# Patient Record
Sex: Male | Born: 1970
Health system: Southern US, Community
[De-identification: ages and names within clinical notes are randomized; demographics above are authoritative.]

## PROBLEM LIST (undated history)

## (undated) DIAGNOSIS — T7840XA Allergy, unspecified, initial encounter: Secondary | ICD-10-CM

## (undated) DIAGNOSIS — Z9889 Other specified postprocedural states: Secondary | ICD-10-CM

## (undated) DIAGNOSIS — M1990 Unspecified osteoarthritis, unspecified site: Secondary | ICD-10-CM

## (undated) DIAGNOSIS — K5090 Crohn's disease, unspecified, without complications: Secondary | ICD-10-CM

## (undated) DIAGNOSIS — Z5189 Encounter for other specified aftercare: Secondary | ICD-10-CM

## (undated) DIAGNOSIS — I480 Paroxysmal atrial fibrillation: Secondary | ICD-10-CM

## (undated) HISTORY — PX: ABLATION: SHX5711

## (undated) HISTORY — DX: Crohn's disease, unspecified, without complications: K50.90

## (undated) HISTORY — DX: Unspecified osteoarthritis, unspecified site: M19.90

## (undated) HISTORY — DX: Other specified postprocedural states: Z98.890

## (undated) HISTORY — DX: Paroxysmal atrial fibrillation: I48.0

## (undated) HISTORY — PX: ESOPHAGEAL DILATION: SHX303

## (undated) HISTORY — DX: Encounter for other specified aftercare: Z51.89

## (undated) HISTORY — DX: Allergy, unspecified, initial encounter: T78.40XA

## (undated) HISTORY — PX: FRACTURE SURGERY: SHX138

---

## 1997-11-14 ENCOUNTER — Other Ambulatory Visit: Admission: RE | Admit: 1997-11-14 | Discharge: 1997-11-14 | Payer: Self-pay | Admitting: Gastroenterology

## 1997-11-22 ENCOUNTER — Ambulatory Visit (HOSPITAL_COMMUNITY): Admission: RE | Admit: 1997-11-22 | Discharge: 1997-11-22 | Payer: Self-pay | Admitting: Gastroenterology

## 1997-12-05 ENCOUNTER — Ambulatory Visit (HOSPITAL_COMMUNITY): Admission: RE | Admit: 1997-12-05 | Discharge: 1997-12-05 | Payer: Self-pay | Admitting: Gastroenterology

## 1997-12-06 ENCOUNTER — Ambulatory Visit (HOSPITAL_COMMUNITY): Admission: RE | Admit: 1997-12-06 | Discharge: 1997-12-06 | Payer: Self-pay | Admitting: Gastroenterology

## 1998-12-10 ENCOUNTER — Ambulatory Visit (HOSPITAL_COMMUNITY): Admission: RE | Admit: 1998-12-10 | Discharge: 1998-12-10 | Payer: Self-pay | Admitting: Podiatry

## 2000-07-14 ENCOUNTER — Emergency Department (HOSPITAL_COMMUNITY): Admission: EM | Admit: 2000-07-14 | Discharge: 2000-07-14 | Payer: Self-pay | Admitting: Emergency Medicine

## 2000-12-18 ENCOUNTER — Emergency Department (HOSPITAL_COMMUNITY): Admission: EM | Admit: 2000-12-18 | Discharge: 2000-12-18 | Payer: Self-pay | Admitting: Emergency Medicine

## 2003-09-14 ENCOUNTER — Emergency Department (HOSPITAL_COMMUNITY): Admission: EM | Admit: 2003-09-14 | Discharge: 2003-09-14 | Payer: Self-pay

## 2004-10-17 ENCOUNTER — Ambulatory Visit: Payer: Self-pay | Admitting: Gastroenterology

## 2005-11-15 ENCOUNTER — Ambulatory Visit (HOSPITAL_COMMUNITY): Admission: EM | Admit: 2005-11-15 | Discharge: 2005-11-15 | Payer: Self-pay | Admitting: *Deleted

## 2005-11-15 DIAGNOSIS — K449 Diaphragmatic hernia without obstruction or gangrene: Secondary | ICD-10-CM | POA: Insufficient documentation

## 2005-11-15 DIAGNOSIS — T18108A Unspecified foreign body in esophagus causing other injury, initial encounter: Secondary | ICD-10-CM | POA: Insufficient documentation

## 2005-11-18 ENCOUNTER — Ambulatory Visit: Payer: Self-pay | Admitting: Internal Medicine

## 2005-11-20 ENCOUNTER — Ambulatory Visit: Payer: Self-pay | Admitting: Gastroenterology

## 2005-11-28 ENCOUNTER — Ambulatory Visit (HOSPITAL_COMMUNITY): Admission: RE | Admit: 2005-11-28 | Discharge: 2005-11-28 | Payer: Self-pay | Admitting: Gastroenterology

## 2006-08-07 ENCOUNTER — Ambulatory Visit: Payer: Self-pay | Admitting: Sports Medicine

## 2006-08-07 DIAGNOSIS — M25579 Pain in unspecified ankle and joints of unspecified foot: Secondary | ICD-10-CM | POA: Insufficient documentation

## 2006-08-07 DIAGNOSIS — M2020 Hallux rigidus, unspecified foot: Secondary | ICD-10-CM | POA: Insufficient documentation

## 2007-01-11 ENCOUNTER — Ambulatory Visit: Payer: Self-pay | Admitting: Gastroenterology

## 2007-01-11 LAB — CONVERTED CEMR LAB
ALT: 20 units/L (ref 0–53)
AST: 18 units/L (ref 0–37)
Alkaline Phosphatase: 59 units/L (ref 39–117)
BUN: 18 mg/dL (ref 6–23)
Basophils Absolute: 0 10*3/uL (ref 0.0–0.1)
Basophils Relative: 0.1 % (ref 0.0–1.0)
Chloride: 108 meq/L (ref 96–112)
Creatinine, Ser: 0.8 mg/dL (ref 0.4–1.5)
Eosinophils Absolute: 0.3 10*3/uL (ref 0.0–0.6)
GFR calc non Af Amer: 116 mL/min
Glucose, Bld: 109 mg/dL — ABNORMAL HIGH (ref 70–99)
Hemoglobin: 15.2 g/dL (ref 13.0–17.0)
Monocytes Absolute: 0.5 10*3/uL (ref 0.2–0.7)
Potassium: 4.2 meq/L (ref 3.5–5.1)
Sodium: 145 meq/L (ref 135–145)
Total Protein: 6.6 g/dL (ref 6.0–8.3)

## 2007-01-25 ENCOUNTER — Ambulatory Visit (HOSPITAL_COMMUNITY): Admission: RE | Admit: 2007-01-25 | Discharge: 2007-01-25 | Payer: Self-pay | Admitting: Gastroenterology

## 2007-01-25 ENCOUNTER — Encounter: Payer: Self-pay | Admitting: Gastroenterology

## 2007-01-25 DIAGNOSIS — K222 Esophageal obstruction: Secondary | ICD-10-CM | POA: Insufficient documentation

## 2007-02-04 ENCOUNTER — Ambulatory Visit: Payer: Self-pay | Admitting: Gastroenterology

## 2007-02-19 ENCOUNTER — Ambulatory Visit: Payer: Self-pay | Admitting: Gastroenterology

## 2007-02-19 ENCOUNTER — Encounter: Payer: Self-pay | Admitting: Gastroenterology

## 2007-03-01 DIAGNOSIS — D139 Benign neoplasm of ill-defined sites within the digestive system: Secondary | ICD-10-CM | POA: Insufficient documentation

## 2007-03-01 DIAGNOSIS — D1399 Benign neoplasm of ill-defined sites within the digestive system: Secondary | ICD-10-CM | POA: Insufficient documentation

## 2007-06-22 DIAGNOSIS — J45909 Unspecified asthma, uncomplicated: Secondary | ICD-10-CM | POA: Insufficient documentation

## 2007-06-22 DIAGNOSIS — K5090 Crohn's disease, unspecified, without complications: Secondary | ICD-10-CM | POA: Insufficient documentation

## 2007-06-22 DIAGNOSIS — R1310 Dysphagia, unspecified: Secondary | ICD-10-CM | POA: Insufficient documentation

## 2007-08-19 ENCOUNTER — Ambulatory Visit: Payer: Self-pay | Admitting: Internal Medicine

## 2007-08-19 DIAGNOSIS — K4090 Unilateral inguinal hernia, without obstruction or gangrene, not specified as recurrent: Secondary | ICD-10-CM | POA: Insufficient documentation

## 2007-08-19 DIAGNOSIS — J301 Allergic rhinitis due to pollen: Secondary | ICD-10-CM | POA: Insufficient documentation

## 2007-08-19 DIAGNOSIS — F909 Attention-deficit hyperactivity disorder, unspecified type: Secondary | ICD-10-CM | POA: Insufficient documentation

## 2007-08-19 DIAGNOSIS — J309 Allergic rhinitis, unspecified: Secondary | ICD-10-CM | POA: Insufficient documentation

## 2007-08-19 LAB — CONVERTED CEMR LAB
AST: 24 units/L (ref 0–37)
Albumin: 4 g/dL (ref 3.5–5.2)
BUN: 18 mg/dL (ref 6–23)
Basophils Absolute: 0.1 10*3/uL (ref 0.0–0.1)
Bilirubin, Direct: 0.1 mg/dL (ref 0.0–0.3)
CO2: 32 meq/L (ref 19–32)
Calcium: 9.7 mg/dL (ref 8.4–10.5)
Eosinophils Absolute: 0.4 10*3/uL (ref 0.0–0.7)
GFR calc Af Amer: 162 mL/min
Glucose, Bld: 91 mg/dL (ref 70–99)
HCT: 44 % (ref 39.0–52.0)
HDL: 25.3 mg/dL — ABNORMAL LOW (ref >39.0)
Ketones, ur: NEGATIVE mg/dL
LDL Cholesterol: 123 mg/dL — ABNORMAL HIGH (ref 0–99)
Lymphocytes Relative: 29.1 % (ref 12.0–46.0)
MCV: 89 fL (ref 78.0–100.0)
Monocytes Absolute: 0.5 10*3/uL (ref 0.1–1.0)
Monocytes Relative: 10.5 % (ref 3.0–12.0)
Neutro Abs: 2.6 10*3/uL (ref 1.4–7.7)
Neutrophils Relative %: 51.2 % (ref 43.0–77.0)
Platelets: 308 10*3/uL (ref 150–400)
Potassium: 4.7 meq/L (ref 3.5–5.1)
RDW: 11.4 % — ABNORMAL LOW (ref 11.5–14.6)
Specific Gravity, Urine: 1.025 (ref 1.000–1.03)
TSH: 0.95 microintl units/mL (ref 0.35–5.50)
Total Bilirubin: 1 mg/dL (ref 0.3–1.2)
Total CHOL/HDL Ratio: 6.6
VLDL: 19 mg/dL (ref 0–40)
pH: 6 (ref 5.0–8.0)

## 2007-08-23 ENCOUNTER — Encounter (INDEPENDENT_AMBULATORY_CARE_PROVIDER_SITE_OTHER): Payer: Self-pay | Admitting: *Deleted

## 2007-09-21 ENCOUNTER — Encounter: Payer: Self-pay | Admitting: Internal Medicine

## 2007-09-21 ENCOUNTER — Encounter: Payer: Self-pay | Admitting: Gastroenterology

## 2009-05-11 ENCOUNTER — Encounter: Payer: Self-pay | Admitting: Internal Medicine

## 2009-08-06 ENCOUNTER — Telehealth: Payer: Self-pay | Admitting: Gastroenterology

## 2009-08-07 ENCOUNTER — Ambulatory Visit: Payer: Self-pay | Admitting: Gastroenterology

## 2009-08-07 DIAGNOSIS — R1033 Periumbilical pain: Secondary | ICD-10-CM | POA: Insufficient documentation

## 2009-08-07 DIAGNOSIS — R634 Abnormal weight loss: Secondary | ICD-10-CM | POA: Insufficient documentation

## 2009-08-07 DIAGNOSIS — K219 Gastro-esophageal reflux disease without esophagitis: Secondary | ICD-10-CM | POA: Insufficient documentation

## 2009-08-07 DIAGNOSIS — R112 Nausea with vomiting, unspecified: Secondary | ICD-10-CM | POA: Insufficient documentation

## 2009-08-07 DIAGNOSIS — R109 Unspecified abdominal pain: Secondary | ICD-10-CM | POA: Insufficient documentation

## 2009-08-07 DIAGNOSIS — K5000 Crohn's disease of small intestine without complications: Secondary | ICD-10-CM | POA: Insufficient documentation

## 2009-08-07 DIAGNOSIS — R1013 Epigastric pain: Secondary | ICD-10-CM | POA: Insufficient documentation

## 2009-08-07 DIAGNOSIS — R110 Nausea: Secondary | ICD-10-CM | POA: Insufficient documentation

## 2009-08-08 ENCOUNTER — Encounter: Payer: Self-pay | Admitting: Physician Assistant

## 2009-08-08 ENCOUNTER — Encounter: Payer: Self-pay | Admitting: Gastroenterology

## 2009-08-09 LAB — CONVERTED CEMR LAB
Albumin: 4.1 g/dL (ref 3.5–5.2)
Calcium: 9.1 mg/dL (ref 8.4–10.5)
Eosinophils Absolute: 0.9 10*3/uL — ABNORMAL HIGH (ref 0.0–0.7)
Eosinophils Relative: 12.2 % — ABNORMAL HIGH (ref 0.0–5.0)
GFR calc non Af Amer: 132.58 mL/min (ref >60)
Glucose, Bld: 96 mg/dL (ref 70–99)
HCT: 42.4 % (ref 39.0–52.0)
Hemoglobin: 15.1 g/dL (ref 13.0–17.0)
Lymphocytes Relative: 23.2 % (ref 12.0–46.0)
MCHC: 35.5 g/dL (ref 30.0–36.0)
MCV: 90.6 fL (ref 78.0–100.0)
Monocytes Relative: 8.3 % (ref 3.0–12.0)
Neutrophils Relative %: 55.9 % (ref 43.0–77.0)
Platelets: 298 10*3/uL (ref 150.0–400.0)
Potassium: 4.4 meq/L (ref 3.5–5.1)
RBC: 4.68 M/uL (ref 4.22–5.81)
RDW: 12.6 % (ref 11.5–14.6)
Total Bilirubin: 0.8 mg/dL (ref 0.3–1.2)
Total Protein: 6.6 g/dL (ref 6.0–8.3)

## 2009-08-10 ENCOUNTER — Ambulatory Visit: Payer: Self-pay | Admitting: Physician Assistant

## 2009-08-13 ENCOUNTER — Ambulatory Visit: Payer: Self-pay | Admitting: Cardiology

## 2009-10-08 ENCOUNTER — Ambulatory Visit: Payer: Self-pay | Admitting: Gastroenterology

## 2009-12-18 ENCOUNTER — Ambulatory Visit: Payer: Self-pay | Admitting: Gastroenterology

## 2010-05-14 NOTE — Assessment & Plan Note (Signed)
Summary: Crohn's....em    History of Present Illness Visit Type: Follow-up Visit Primary GI MD: Melvia Heaps MD [facility] Primary Provider: n/a Requesting Provider: n/a Chief Complaint: F/u  from Crohn's. Patient states that it feels like food is not going down when he swallows, diarrhea, and nausea  History of Present Illness:   Daniel Orozco has returned for followup of his abdominal pain.  CT Scan and lab work including CRP were unremarkable.  He took a two-week course of prednisone beginning at 20 mg daily.  His symptoms did improve though they remain.  He is without nausea and vomiting but still has postprandial fullness.  He's off prednisone and is taking  Pentasa 2 g twice a day.   GI Review of Systems    Reports nausea.      Denies abdominal pain, acid reflux, belching, bloating, chest pain, dysphagia with liquids, dysphagia with solids, heartburn, loss of appetite, vomiting, vomiting blood, weight loss, and  weight gain.      Reports diarrhea.     Denies anal fissure, black tarry stools, change in bowel habit, constipation, diverticulosis, fecal incontinence, heme positive stool, hemorrhoids, irritable bowel syndrome, jaundice, light color stool, liver problems, rectal bleeding, and  rectal pain.    Current Medications (verified): 1)  Citalopram Hydrobromide 40 Mg  Tabs (Citalopram Hydrobromide) .... 2 By Mouth Qd 2)  Divalproex Sodium 500 Mg Xr24h-Tab (Divalproex Sodium) .Marland Kitchen.. 1 By Mouth Three Times A Day 3)  Pentasa 500 Mg Cr-Caps (Mesalamine) .... Take 4 Tab 2 Times Daily 4)  Bentyl 10 Mg Caps (Dicyclomine Hcl) .... Take 1 Tab Twice Daily  Allergies (verified): 1)  ! * Codiene  Past History:  Past Medical History: Reviewed history from 08/07/2009 and no changes required. Current Problems:  ASTHMA (ICD-493.90) CROHNS DISEASE  HIATAL HERNIA (ICD-553.3)  ESOPHAGEAL STRICTURE (ICD-530.3)/SP DILATIONS   HALLUX RIGIDUS, ACQUIRED (ICD-735.2) PAIN IN JOINT, ANKLE/FOOT  (ICD-719.47) Allergic rhinitis ADHD Sleep Apnea  Past Surgical History: Reviewed history from 08/07/2009 and no changes required. s/p right foot surgury Hernia Surgery  inguinil and umbilical  Family History: Reviewed history from 08/07/2009 and no changes required. father and grandfather with prostate cancer (father at 46yo) mother with sprue, IBS No FH of Colon Cancer: Family History of Inflammatory Bowel Disease:mother  Social History: Reviewed history from 08/19/2007 and no changes required. 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 Smoked Alcohol use-yes - rare Drug use-no Married 3 kids  Review of Systems  The patient denies allergy/sinus, anemia, anxiety-new, arthritis/joint pain, back pain, blood in urine, breast changes/lumps, change in vision, confusion, cough, coughing up blood, depression-new, fainting, fatigue, fever, headaches-new, hearing problems, heart murmur, heart rhythm changes, itching, menstrual pain, muscle pains/cramps, night sweats, nosebleeds, pregnancy symptoms, shortness of breath, skin rash, sleeping problems, sore throat, swelling of feet/legs, swollen lymph glands, thirst - excessive , urination - excessive , urination changes/pain, urine leakage, vision changes, and voice change.    Vital Signs:  Patient profile:   40 year old male Height:      73 inches Weight:      197 pounds BMI:     26.08 BSA:     2.14 Pulse rate:   96 / minute Pulse rhythm:   regular BP sitting:   110 / 76  (left arm) Cuff size:   regular  Vitals Entered By: Daniel Orozco CMA (October 08, 2009 2:42 PM)   Impression & Recommendations:  Problem # 1:  CROHN'S DISEASE-SMALL INTESTINE (ICD-555.0) Despite his negative CT  scan I am strongly suspicious that symptoms are due to a mild flareup of his jejunal Crohn's disease.  Recommendations #1 resume prednisone 20 mg daily for one week. If he only gets partial improvement I will increase it to 40 mg and  taper more slowly #2 continue Pentasa  I carefully instructed Daniel Orozco to call back in on week to report his progress, sooner if necessary  Patient Instructions: 1)  We are sending in a prescription to your pharmacy 2)  Please schedule a follow up appointment in 3 weeks 3)  The medication list was reviewed and reconciled.  All changed / newly prescribed medications were explained.  A complete medication list was provided to the patient / caregiver. Prescriptions: PREDNISONE 10 MG TABS (PREDNISONE) take 2 tabs daily for 7 days then one and a half tablets daily  #50 x 2   Entered and Authorized by:   Daniel Meckel MD   Signed by:   Daniel Meckel MD on 10/08/2009   Method used:   Electronically to        [facility] (retail)       [address]       [place], [place]  [address]       Ph: [phone]       Fax: [phone]   RxID:   [id number]

## 2010-05-14 NOTE — Op Note (Signed)
Summary: [facility]  [facility]   Imported By: Sherian Rein 05/17/2009 11:36:14  _____________________________________________________________________  External Attachment:    Type:   Image     Comment:   External Document

## 2010-05-14 NOTE — Assessment & Plan Note (Signed)
Summary: ABD. PAIN/NAUSEA AFTER MEALS, GETTING WORSE   (DanielKAPLAN PT.).Marland KitchenMarland Kitchen    History of Present Illness Visit Type: Follow-up Visit Primary GI MD: Daniel Heaps MD [facility] Primary Provider: n/a Requesting Provider: n/a Chief Complaint: Abdominal pain and nausea and diarrhea History of Present Illness:   [name] KNOWN TO Daniel Orozco WITH HX OF CROHNS DISEASE/SMALL BOWEL. HE HAD JEJUNAL DISEASE WHEN INITIALLY DX IN 1999. COLONOSCOPY IN 2008 WAS NORMAL EXCEPT FOR PSEUDOPOLYPS IN THE ILEUM.HE HAS NOT BEEN ON ANY MEDS OVER THE PAST COUPLE YEARS AS HIS DISEASE HAS BEEN QUIESCENT. HE COMES IN TODAY WITH RECURRENT UPPER ABDOMINAL PAIN OVER THE PAST 2-3 WEEKS. HE DESCRIBES UPPER ABDOMINAL PAIN 15-20 MINUTES AFTER MEALS,CRAMPY,ASSOCIATED WITH NAUSEA,SOMETIMES VOMITING,THEN LOOSE BM. HE FEELS BETTER IF HE DOESN'T EAT,WEIGHT DONE 5 PONDS. HE C/O BLOATING,FATIGUE, NO MELENA,HEME,NO FEVERS.HE HAS 4-5 BM'S/DAY IF HE EATS MEALS.HE SAYS THIS FEELS JUST LIKE IT DID IN 1999.   GI Review of Systems    Reports abdominal pain, bloating, loss of appetite, nausea, vomiting, and  weight loss.     Location of  Abdominal pain: upper abdomen. Weight loss of unsure pounds   Denies acid reflux, belching, chest pain, dysphagia with liquids, dysphagia with solids, heartburn, and  vomiting blood.      Reports change in bowel habits and  diarrhea.     Denies anal fissure, black tarry stools, diverticulosis, fecal incontinence, heme positive stool, hemorrhoids, irritable bowel syndrome, jaundice, light color stool, liver problems, rectal bleeding, and  rectal pain.    Current Medications (verified): 1)  Citalopram Hydrobromide 40 Mg  Tabs (Citalopram Hydrobromide) .... 2 By Mouth Qd 2)  Divalproex Sodium 500 Mg Xr24h-Tab (Divalproex Sodium) .Marland Kitchen.. 1 By Mouth Three Times A Day  Allergies (verified): 1)  ! * Codiene  Past History:  Past Medical History: Current Problems:  ASTHMA (ICD-493.90) CROHNS  DISEASE  HIATAL HERNIA (ICD-553.3)  ESOPHAGEAL STRICTURE (ICD-530.3)/SP DILATIONS   HALLUX RIGIDUS, ACQUIRED (ICD-735.2) PAIN IN JOINT, ANKLE/FOOT (ICD-719.47) Allergic rhinitis ADHD Sleep Apnea  Past Surgical History: s/p right foot surgury Hernia Surgery  inguinil and umbilical  Family History: father and grandfather with prostate cancer (father at 77yo) mother with sprue, IBS No FH of Colon Cancer: Family History of Inflammatory Bowel Disease:mother  Social History: Reviewed history from 08/19/2007 and no changes required. 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  The patient denies anemia, anxiety-new, arthritis/joint pain, back pain, blood in urine, breast changes/lumps, change in vision, confusion, cough, coughing up blood, depression-new, fainting, fatigue, fever, headaches-new, hearing problems, heart murmur, heart rhythm changes, itching, menstrual pain, muscle pains/cramps, night sweats, nosebleeds, pregnancy symptoms, skin rash, sore throat, swelling of feet/legs, swollen lymph glands, thirst - excessive , urination - excessive , urination changes/pain, urine leakage, vision changes, and voice change.         ROS OTHERWISE AS IN HPI  Vital Signs:  Patient profile:   40 year old male Height:      73 inches Weight:      194 pounds BMI:     25.69 BSA:     2.13 Pulse rate:   100 / minute Pulse rhythm:   regular BP sitting:   104 / 74  (left arm)  Vitals Entered By: Merri Ray CMA Duncan Dull) (August 07, 2009 2:11 PM)  Physical Exam  General:  Well developed, well nourished, no acute distress. Head:  Normocephalic and atraumatic. Eyes:  PERRLA, no icterus. Lungs:  Clear throughout to auscultation. Heart:  Regular rate and rhythm; no murmurs, rubs,  or bruits. Abdomen:   SOFT, TENDER MID UPPER ABDOMEN, NO GUARDING,REBOUND, NO MASS OR HSM,BS+ Rectal:  NOT DONE Extremities:  No clubbing, cyanosis, edema or deformities noted. Neurologic:  Alert and  oriented x4;  grossly normal neurologically. Psych:  Alert and cooperative. Normal mood and affect.   Impression & Recommendations:  Problem # 1:  CROHN'S DISEASE-SMALL INTESTINE (ICD-555.0) Assessment Deteriorated 40 YO MALE WITH DX OF CROHNS WHO HAD EVIDENCE OF JEJUNAL DISEASE WHEN DIAGNOSED IN 1999- HAS BEEN IN REMISSION FOR SEVERLA YEARS-NOW WITH RECURRENT UPPER ABDOMINAL CRAMPY PAIN, NAUSEA AND VOMITING OVER THE PAST 3 WEEKS,LOOSE STOOLS.   PROBABLE EXACERBATION OF UPPER SMALL BOWEL CROHNS,R/O OBSTRUCTIVE COMPONENT.  LABS AS BELOW SCHEDULE FOR CT SCAN ABD/PELVIS START PENTASA 250   ;4 TABS   4 X DAILY START PREDNISONE 20 MG DAILY FULL LIQUID TO SOFT DIET BENTYL 10 MG TWICE DAILY FOLLOW UP APPT WITH Daniel Orozco IN 2 WEEKS.  Problem # 2:  GERD (ICD-530.81) Assessment: Comment Only STABLE  Problem # 3:  ADHD (ICD-314.01) Assessment: Comment Only  Other Orders: TLB-CMP (Comprehensive Metabolic Pnl) (80053-COMP) TLB-CRP-High Sensitivity (C-Reactive Protein) (86140-FCRP) TLB-CBC Platelet - w/Differential (85025-CBCD) CT Abdomen/Pelvis with Contrast (CT Abd/Pelvis w/con)  Patient Instructions: 1)  Your physician has requested that you have the following labwork done today:Go to basement level. 2)  We sent perscriptions for Bentyl , Prednisone, and Pentasa to [facility] [address]. 3)  We scheduled the CT scan for 08-09-09. Directions and contrast provided. 4)  The medication list was reviewed and reconciled.  All changed / newly prescribed medications were explained.  A complete medication list was provided to the patient / caregiver. Prescriptions: BENTYL 10 MG CAPS (DICYCLOMINE HCL) Take 1 tab twice daily  #60 x 1   Entered by:   Lowry Ram NCMA   Authorized by:   Sammuel Cooper PA-c   Signed  by:   Lowry Ram NCMA on 08/07/2009   Method used:   Electronically to        [facility] (retail)       [address]       [place], [place]  [address]       Ph: [phone]       Fax: [phone]   RxID:   [id number] PENTASA 500 MG CR-CAPS (MESALAMINE) Take 4 tab 4 times daily  #480 x 1   Entered by:   Lowry Ram NCMA   Authorized by:   Sammuel Cooper PA-c   Signed by:   Lowry Ram NCMA on 08/07/2009   Method used:   Electronically to        [facility] (retail)       [address]       [place], [place]  [address]       Ph: [phone]       Fax: [phone]   RxID:   [id number] PREDNISONE 20 MG TABS (PREDNISONE) Take 1 tab daily  #60 x 1   Entered by:   Lowry Ram NCMA   Authorized by:   Sammuel Cooper PA-c   Signed by:   Lowry Ram NCMA on 08/07/2009   Method used:  Electronically to        [facility] (retail)       [address]       [place], [place]  [address]       Ph: [phone]       Fax: [phone]   RxID:   [id number]

## 2010-05-14 NOTE — Assessment & Plan Note (Signed)
Summary: F/U APPT...LSW.    History of Present Illness Visit Type: Follow-up Visit Primary GI MD: Melvia Heaps MD [facility] Primary Provider: n/a Requesting Provider: n/a Chief Complaint: follow-up Crohn's  c/o of  epigastric pain after eating alot of food  History of Present Illness:   Mr. Aguayo has returned for followup of his Crohn's disease.  His main complaint is intermittent postprandial epigastric discomfort.  This may occur approximately half an hour after eating.  He remains on Pentasa.   GI Review of Systems      Denies abdominal pain, acid reflux, belching, bloating, chest pain, dysphagia with liquids, dysphagia with solids, heartburn, loss of appetite, nausea, vomiting, vomiting blood, weight loss, and  weight gain.        Denies anal fissure, black tarry stools, change in bowel habit, constipation, diarrhea, diverticulosis, fecal incontinence, heme positive stool, hemorrhoids, irritable bowel syndrome, jaundice, light color stool, liver problems, rectal bleeding, and  rectal pain.    Current Medications (verified): 1)  Citalopram Hydrobromide 40 Mg  Tabs (Citalopram Hydrobromide) .... 2 By Mouth Qd 2)  Pentasa 500 Mg Cr-Caps (Mesalamine) .... Take 4 Tab 2 Times Daily 3)  Bentyl 10 Mg Caps (Dicyclomine Hcl) .... Take 1 Tab Twice Daily  Allergies (verified): 1)  ! * Codiene  Past History:  Past Medical History: Reviewed history from 08/07/2009 and no changes required. Current Problems:  ASTHMA (ICD-493.90) CROHNS DISEASE  HIATAL HERNIA (ICD-553.3)  ESOPHAGEAL STRICTURE (ICD-530.3)/SP DILATIONS   HALLUX RIGIDUS, ACQUIRED (ICD-735.2) PAIN IN JOINT, ANKLE/FOOT (ICD-719.47) Allergic rhinitis ADHD Sleep Apnea  Past Surgical History: Reviewed history from 08/07/2009 and no changes required. s/p right foot surgury Hernia Surgery  inguinil and umbilical  Family History: Reviewed history from 08/07/2009 and no changes required. father and grandfather with  prostate cancer (father at 60yo) mother with sprue, IBS No FH of Colon Cancer: Family History of Inflammatory Bowel Disease:mother  Social History: Reviewed history from 08/19/2007 and no changes required. 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 Smoked Alcohol use-yes - rare Drug use-no Married 3 kids  Review of Systems  The patient denies allergy/sinus, anemia, anxiety-new, arthritis/joint pain, back pain, blood in urine, breast changes/lumps, change in vision, confusion, cough, coughing up blood, depression-new, fainting, fatigue, fever, headaches-new, hearing problems, heart murmur, heart rhythm changes, itching, muscle pains/cramps, night sweats, nosebleeds, shortness of breath, skin rash, sleeping problems, sore throat, swelling of feet/legs, swollen lymph glands, thirst - excessive, urination - excessive, urination changes/pain, urine leakage, vision changes, and voice change.    Vital Signs:  Patient profile:   40 year old male Height:      73 inches Weight:      197.25 pounds BMI:     26.12 Pulse rate:   84 / minute Pulse rhythm:   regular BP sitting:   116 / 72  (left arm)  Vitals Entered By: Milford Cage NCMA (December 18, 2009 8:37 AM)   Impression & Recommendations:  Problem # 1:  CROHN'S DISEASE-SMALL INTESTINE (ICD-555.0) His persistent postprandial pain could be related to mild Crohn's disease involving the jejunum.  He is clearly  improved after a short course of steroids.    Recommendations #1 trial of AcipHex 20 mg daily and Bentyl p.r.n. #2 he was instructed to contact me if not  improved after approximately week at which point I would consider upper endoscopy  Patient Instructions: 1)  We are giving you Aciphex samples today 2)  The medication list was reviewed and reconciled.  All changed / newly prescribed medications were explained.  A complete medication list was provided to the patient / caregiver. Prescriptions: PENTASA  500 MG CR-CAPS (MESALAMINE) Take 2 tabs q.i.d.  #240 x 12   Entered and Authorized by:   Louis Meckel MD   Signed by:   Louis Meckel MD on 12/18/2009   Method used:   Electronically to        [facility] (retail)       [address]       [place], [place]  [address]       Ph: [phone]       Fax: [phone]   RxID:   [id number] ACIPHEX 20 MG TBEC (RABEPRAZOLE SODIUM) take one tab daily  #30 x 2   Entered and Authorized by:   Louis Meckel MD   Signed by:   Louis Meckel MD on 12/18/2009   Method used:   Electronically to        [facility] (retail)       [address]       [place], [place]  [address]       Ph: [phone]       Fax: [phone]   RxID:   [id number]

## 2010-05-14 NOTE — Miscellaneous (Signed)
Summary: Pentasa  Clinical Lists Changes  Medications: Changed medication from PENTASA 500 MG CR-CAPS (MESALAMINE) Take 4 tab 4 times daily to PENTASA 500 MG CR-CAPS (MESALAMINE) Take 4 tab 2 times daily - Signed Rx of PENTASA 500 MG CR-CAPS (MESALAMINE) Take 4 tab 2 times daily;  #240 x 3;  Signed;  Entered by: Lowry Ram NCMA;  Authorized by: Sammuel Cooper PA-c;  Method used: Electronically to [facility] [address] [place], [place], [place]  [address], Ph: [phone], Fax: [phone]    Prescriptions: PENTASA 500 MG CR-CAPS (MESALAMINE) Take 4 tab 2 times daily  #240 x 3   Entered by:   Lowry Ram NCMA   Authorized by:   Sammuel Cooper PA-c   Signed by:   Lowry Ram NCMA on 08/08/2009   Method used:   Electronically to        [facility] (retail)       [address]       [place], [place]  [address]       Ph: [phone]       Fax: [phone]   RxID:   [id number]

## 2010-05-14 NOTE — Miscellaneous (Signed)
Summary: CT ABD Pelvis  Clinical Lists Changes  Orders: Added new Referral order of CT Abdomen/Pelvis with Contrast (CT Abd/Pelvis w/con) - Signed  Appended Document: CT ABD Pelvis Called pt and rescheduled the CT ABD and PELVIS  and for Mon 08-13-09 at 8:30 AM.  Rescheduled with Rose at [address]

## 2010-05-14 NOTE — Progress Notes (Signed)
Summary: Triage   Phone Note Call from Patient Call back at [phone]   Caller: Patient Call For: Dr. Arlyce Dice Reason for Call: Acute Illness Details for Reason: Having problems. Summary of Call: After eating-15 mins. severe abdom pn, vomitting. Clear liquids stay down- drinking them slowly. No large amounts. Please advise.  Initial call taken by: Zackery Barefoot,  August 06, 2009 9:31 AM  Follow-up for Phone Call        Pt. has hx. of Crohn's.  Pt. c/o worsening episodes of abd. pain/nausea after meals.  Goes between constipation and diarrhea. Denies blood, mucus, fever.  Pt. will see Mike Gip PAC on 08-07-09 at 2pm.  Follow-up by: Laureen Ochs LPN,  August 06, 2009 11:38 AM

## 2010-07-23 ENCOUNTER — Telehealth: Payer: Self-pay | Admitting: *Deleted

## 2010-07-23 NOTE — Telephone Encounter (Signed)
PT TO RETURN CALL HE WAS IN A MEETING, HE NEEDS YEARLY F/U OFFICE APPOINTMENT AND CBC,CMP,HEPATIC DX CROHNS

## 2010-08-05 ENCOUNTER — Encounter: Payer: Self-pay | Admitting: *Deleted

## 2010-08-05 NOTE — Telephone Encounter (Signed)
Letter mailed today to pt

## 2010-08-27 NOTE — Assessment & Plan Note (Signed)
[facility]                         GASTROENTEROLOGY OFFICE NOTE   NAME:Daniel Orozco, Daniel Orozco               MRN:          [id number]  DATE:01/11/2007                            DOB:          06/30/1970    PROBLEM LIST:  1. Crohn's disease.  2. Dysphagia.   [name] has returned for scheduled routine followup.  He has Crohn's  disease involving the small bowel for which he is maintained on Pentasa.  His disease has been quiescent for several years.  He denies abdominal  pain or change of bowel habits, melena or hematochezia.  He has a known  esophageal stricture for which he was dilated 13 months ago.  He is  currently having recurrent dysphagia to solids.  He denies pyrosis.   PHYSICAL EXAMINATION:  Pulse 68, blood pressure 106/78, weight 181.  HEENT: EOMI.  PERRLA.  Sclerae are anicteric.  Conjunctivae are pink.  NECK:  Supple without thyromegaly, adenopathy or carotid bruits.  CHEST:  Clear to auscultation and percussion without adventitious  sounds.  CARDIAC:  Regular rhythm; normal S1 S2.  There are no murmurs, gallops  or rubs.  ABDOMEN:  Bowel sounds are normoactive.  Abdomen is soft, nontender and  nondistended.  There are no abdominal masses, tenderness, splenic  enlargement or hepatomegaly.  EXTREMITIES:  Full range of motion.  No cyanosis, clubbing or edema.  RECTAL:  Deferred.   IMPRESSION:  1. History of Crohn's disease involving the small bowel.  2. Recurrent esophageal stricture.   RECOMMENDATION:  1. Check CBC and CMET.  2. Colonoscopy with the intention of examining the terminal ileum.  3. Upper endoscopy with balloon dilatation.     Barbette Hair. Arlyce Dice, MD,FACG  Electronically Signed    RDK/MedQ  DD: 01/11/2007  DT: 01/11/2007  Job #: [id number]

## 2010-08-30 NOTE — Assessment & Plan Note (Signed)
[facility]                                   ON-CALL NOTE   NAME:COULONCristal Orozco                   MRN:          [id number]  DATE:11/15/2005                            DOB:          [birth year]    Caller is his wife, Daniel Orozco.   TIME OF CALL:  Was 1:39 p.m.   REASON FOR CALL:  Food impaction.   HISTORY:  This is a patient of Dr. Arlyce Dice, who is followed for Crohn  disease.  He has had chronic intermittent problems with solid food dysphagia  which he has not brought to medical attention.  His wife reports that he ate  a chicken sandwich this morning and has since had what appears to be a food  impaction.  At the time of the call, they were on their way to [facility].  He will be evaluated by EDP and if the food impaction has not  passed, then I informed them that endoscopy would be needed.                                   Wilhemina Bonito. Eda Keys., MD   JNP/MedQ  DD:  11/15/2005  DT:  11/15/2005  Job #:  [id number]   cc:   Barbette Hair. Arlyce Dice, MD, [facility]

## 2010-12-03 ENCOUNTER — Encounter: Payer: Self-pay | Admitting: Gastroenterology

## 2012-01-08 ENCOUNTER — Encounter: Payer: Self-pay | Admitting: Gastroenterology

## 2012-11-01 ENCOUNTER — Other Ambulatory Visit: Payer: Self-pay | Admitting: Emergency Medicine

## 2012-11-01 ENCOUNTER — Ambulatory Visit: Payer: 59

## 2012-11-01 ENCOUNTER — Ambulatory Visit (INDEPENDENT_AMBULATORY_CARE_PROVIDER_SITE_OTHER): Payer: 59 | Admitting: Emergency Medicine

## 2012-11-01 VITALS — BP 132/78 | HR 91 | Temp 98.6°F | Resp 18 | Ht 72.5 in | Wt 182.0 lb

## 2012-11-01 DIAGNOSIS — M25572 Pain in left ankle and joints of left foot: Secondary | ICD-10-CM

## 2012-11-01 DIAGNOSIS — M25579 Pain in unspecified ankle and joints of unspecified foot: Secondary | ICD-10-CM

## 2012-11-01 LAB — BASIC METABOLIC PANEL WITH GFR
BUN: 15 mg/dL (ref 6–23)
CO2: 28 mEq/L (ref 19–32)
Chloride: 105 mEq/L (ref 96–112)
Creat: 0.83 mg/dL (ref 0.50–1.35)
GFR, Est African American: >89 mL/min
Potassium: 4.6 mEq/L (ref 3.5–5.3)

## 2012-11-01 NOTE — Progress Notes (Signed)
  Subjective:    Patient ID: Daniel Orozco, male    DOB: 02/11/1971, 42 y.o.   MRN: [id number]  HPI patient enters with pain and swelling of his left foot. He denies any injury. He does have a family history of gout.    Review of Systems     Objective:   Physical Exam there is redness and swelling with increased warmth around the left ankle.    [facility] reading (PRIMARY) by  Dr. Cleta Alberts no fracture seen soft tissue swelling noted      Assessment & Plan:  History and physical exam are most consistent with gout . Uric acid was done .

## 2014-02-23 DIAGNOSIS — R519 Headache, unspecified: Secondary | ICD-10-CM | POA: Insufficient documentation

## 2014-02-23 DIAGNOSIS — F39 Unspecified mood [affective] disorder: Secondary | ICD-10-CM | POA: Insufficient documentation

## 2014-02-23 DIAGNOSIS — R51 Headache: Secondary | ICD-10-CM

## 2014-02-23 DIAGNOSIS — H539 Unspecified visual disturbance: Secondary | ICD-10-CM | POA: Insufficient documentation

## 2014-02-23 DIAGNOSIS — R0683 Snoring: Secondary | ICD-10-CM | POA: Insufficient documentation

## 2015-03-26 ENCOUNTER — Encounter: Payer: Self-pay | Admitting: Physician Assistant

## 2015-03-27 ENCOUNTER — Ambulatory Visit (INDEPENDENT_AMBULATORY_CARE_PROVIDER_SITE_OTHER): Payer: 59 | Admitting: Physician Assistant

## 2015-03-27 ENCOUNTER — Observation Stay (HOSPITAL_COMMUNITY): Payer: 59

## 2015-03-27 ENCOUNTER — Other Ambulatory Visit: Payer: Self-pay | Admitting: Physician Assistant

## 2015-03-27 ENCOUNTER — Observation Stay (HOSPITAL_COMMUNITY)
Admission: AD | Admit: 2015-03-27 | Discharge: 2015-03-28 | Disposition: A | Discharge status: Home / Self Care | Payer: 59 | Source: Ambulatory Visit | Attending: Interventional Cardiology | Admitting: Interventional Cardiology

## 2015-03-27 ENCOUNTER — Encounter (HOSPITAL_COMMUNITY): Payer: Self-pay

## 2015-03-27 ENCOUNTER — Encounter: Payer: Self-pay | Admitting: Physician Assistant

## 2015-03-27 VITALS — BP 98/80 | HR 62 | Ht 73.0 in | Wt 182.4 lb

## 2015-03-27 DIAGNOSIS — I480 Paroxysmal atrial fibrillation: Secondary | ICD-10-CM | POA: Diagnosis not present

## 2015-03-27 DIAGNOSIS — I4891 Unspecified atrial fibrillation: Secondary | ICD-10-CM

## 2015-03-27 DIAGNOSIS — Z7901 Long term (current) use of anticoagulants: Secondary | ICD-10-CM | POA: Insufficient documentation

## 2015-03-27 DIAGNOSIS — K5090 Crohn's disease, unspecified, without complications: Secondary | ICD-10-CM | POA: Insufficient documentation

## 2015-03-27 DIAGNOSIS — R531 Weakness: Secondary | ICD-10-CM | POA: Diagnosis not present

## 2015-03-27 DIAGNOSIS — Z885 Allergy status to narcotic agent status: Secondary | ICD-10-CM | POA: Diagnosis not present

## 2015-03-27 DIAGNOSIS — Z23 Encounter for immunization: Secondary | ICD-10-CM | POA: Insufficient documentation

## 2015-03-27 LAB — PROTIME-INR
INR: 1.1 (ref 0.00–1.49)
PROTHROMBIN TIME: 14.4 s (ref 11.6–15.2)

## 2015-03-27 LAB — COMPREHENSIVE METABOLIC PANEL
ALT: 28 U/L (ref 17–63)
AST: 19 U/L (ref 15–41)
Albumin: 3.8 g/dL (ref 3.5–5.0)
Alkaline Phosphatase: 56 U/L (ref 38–126)
Anion gap: 7 (ref 5–15)
BILIRUBIN TOTAL: 1.5 mg/dL — AB (ref 0.3–1.2)
BUN: 10 mg/dL (ref 6–20)
CO2: 27 mmol/L (ref 22–32)
CREATININE: 0.97 mg/dL (ref 0.61–1.24)
Calcium: 9.4 mg/dL (ref 8.9–10.3)
Chloride: 106 mmol/L (ref 101–111)
Glucose, Bld: 139 mg/dL — ABNORMAL HIGH (ref 65–99)
POTASSIUM: 4.6 mmol/L (ref 3.5–5.1)
Sodium: 140 mmol/L (ref 135–145)
TOTAL PROTEIN: 6.4 g/dL — AB (ref 6.5–8.1)

## 2015-03-27 LAB — TROPONIN I
Troponin I: <0.03 ng/mL (ref <0.031)
Troponin I: <0.03 ng/mL (ref <0.031)
Troponin I: <0.03 ng/mL (ref <0.031)

## 2015-03-27 LAB — CBC WITH DIFFERENTIAL/PLATELET
BASOS ABS: 0 10*3/uL (ref 0.0–0.1)
Basophils Relative: 1 %
EOS PCT: 3 %
Eosinophils Absolute: 0.2 10*3/uL (ref 0.0–0.7)
HEMATOCRIT: 43.5 % (ref 39.0–52.0)
Hemoglobin: 15.3 g/dL (ref 13.0–17.0)
LYMPHS ABS: 1.1 10*3/uL (ref 0.7–4.0)
LYMPHS PCT: 18 %
MCH: 30.7 pg (ref 26.0–34.0)
MCHC: 35.2 g/dL (ref 30.0–36.0)
MCV: 87.3 fL (ref 78.0–100.0)
MONO ABS: 0.4 10*3/uL (ref 0.1–1.0)
MONOS PCT: 7 %
NEUTROS ABS: 4.2 10*3/uL (ref 1.7–7.7)
Neutrophils Relative %: 71 %
Platelets: 298 10*3/uL (ref 150–400)
RBC: 4.98 MIL/uL (ref 4.22–5.81)
RDW: 12.4 % (ref 11.5–15.5)
WBC: 5.8 10*3/uL (ref 4.0–10.5)

## 2015-03-27 LAB — HEPARIN LEVEL (UNFRACTIONATED): Heparin Unfractionated: <0.1 IU/mL — ABNORMAL LOW (ref 0.30–0.70)

## 2015-03-27 LAB — TSH: TSH: 1.048 u[IU]/mL (ref 0.350–4.500)

## 2015-03-27 LAB — APTT: APTT: 26 s (ref 24–37)

## 2015-03-27 MED ORDER — HEPARIN (PORCINE) IN NACL 100-0.45 UNIT/ML-% IJ SOLN
INTRAMUSCULAR | Status: AC
  Filled 2015-03-27: qty 250

## 2015-03-27 MED ORDER — SODIUM CHLORIDE 0.9 % IV SOLN
INTRAVENOUS | Status: DC
  Administered 2015-03-27: 12:00:00 via INTRAVENOUS

## 2015-03-27 MED ORDER — HEPARIN BOLUS VIA INFUSION
2400.0000 [IU] | Freq: Once | INTRAVENOUS | Status: AC
  Administered 2015-03-27: 2400 [IU] via INTRAVENOUS
  Filled 2015-03-27: qty 2400

## 2015-03-27 MED ORDER — SODIUM CHLORIDE 0.9 % IV SOLN: 250.0000 mL | INTRAVENOUS | Status: DC

## 2015-03-27 MED ORDER — SODIUM CHLORIDE 0.9 % IJ SOLN: 3.0000 mL | Freq: Two times a day (BID) | INTRAMUSCULAR | Status: DC

## 2015-03-27 MED ORDER — HEPARIN (PORCINE) IN NACL 100-0.45 UNIT/ML-% IJ SOLN
1500.0000 [IU]/h | INTRAMUSCULAR | Status: DC
  Administered 2015-03-27: 1150 [IU]/h via INTRAVENOUS
  Administered 2015-03-28: 1350 [IU]/h via INTRAVENOUS
  Filled 2015-03-27: qty 250

## 2015-03-27 MED ORDER — HEPARIN BOLUS VIA INFUSION
4000.0000 [IU] | Freq: Once | INTRAVENOUS | Status: AC
  Administered 2015-03-27: 4000 [IU] via INTRAVENOUS
  Filled 2015-03-27: qty 4000

## 2015-03-27 MED ORDER — INFLUENZA VAC SPLIT QUAD 0.5 ML IM SUSY: 0.5000 mL | PREFILLED_SYRINGE | INTRAMUSCULAR | Status: DC

## 2015-03-27 MED ORDER — DEXTROSE 5 % IV SOLN
5.0000 mg/h | INTRAVENOUS | Status: DC
  Administered 2015-03-27: 5 mg/h via INTRAVENOUS
  Administered 2015-03-27 – 2015-03-28 (×3): 12.5 mg/h via INTRAVENOUS
  Filled 2015-03-27 (×4): qty 100

## 2015-03-27 MED ORDER — SODIUM CHLORIDE 0.9 % IJ SOLN: 3.0000 mL | INTRAMUSCULAR | Status: DC | PRN

## 2015-03-27 MED ORDER — SODIUM CHLORIDE 0.9 % IV SOLN
INTRAVENOUS | Status: DC
  Administered 2015-03-27: 23:00:00 via INTRAVENOUS

## 2015-03-27 MED ORDER — DILTIAZEM LOAD VIA INFUSION
10.0000 mg | Freq: Once | INTRAVENOUS | Status: AC
  Administered 2015-03-27: 10 mg via INTRAVENOUS
  Filled 2015-03-27: qty 10

## 2015-03-27 MED ORDER — ONDANSETRON HCL 4 MG/2ML IJ SOLN: 4.0000 mg | Freq: Four times a day (QID) | INTRAMUSCULAR | Status: DC | PRN

## 2015-03-27 MED ORDER — ACETAMINOPHEN 325 MG PO TABS: 650.0000 mg | ORAL_TABLET | ORAL | Status: DC | PRN

## 2015-03-27 NOTE — H&P (Signed)
cr    Cardiology Office Note   Date:  03/27/2015   ID:  [name], DOB 01-09-1971, MRN [id number]  PCP:  Cathlean Cower, MD  Cardiologist:  New- Dr. Tamala Julian   CC: Atrial fibrillation with RVR   History of Present Illness: Daniel Orozco is a 44 y.o. male with no past cardiac history presented for evaluation of insomnia onset atrial fibrillation with RVR. According to the patient, he has never seen a cardiologist before. He has no family history of cardiac illness. He does have history of alcohol abuse drink roughly 2-3 beers per day. He has history of esophageal stricture s/p balloon dilatation > 10 yrs ago. He also has a history of Crohn's disease. 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 He has also been under tremendous amount of stress at work. Otherwise he denies any recent fever or cough.  He started having palpitations and intermittent dizziness last Friday on 03/23/2015 at work. He went home early that day. He continued to have palpitation feeling throughout the weekend. He eventually sought medical attention at local urgent care on 03/26/2015 who asked him to take 325 mg aspirin every 4 hours. He presents is today for cardiology evaluation, EKG shows is currently in atrial fibrillation with RVR with heart rate 180s. His blood pressure is 98/80. He denies any current symptom of dizziness or feeling of passing out. He denies any thyroid history.    Past Medical History  Diagnosis Date  . Arthritis   . S/P balloon dilatation of esophageal stricture   . Crohn disease ([place])     in remission since 2000    Past Surgical History  Procedure Laterality Date  . Fracture surgery    . Esophageal dilation       Current Facility-Administered Medications  Medication Dose Route Frequency Provider Last Rate Last Dose  . 0.9 %  sodium chloride infusion   Intravenous Continuous Almyra Deforest, PA      . 0.9 %  sodium chloride infusion  250 mL Intravenous  Continuous Almyra Deforest, PA      . acetaminophen (TYLENOL) tablet 650 mg  650 mg Oral Q4H PRN Almyra Deforest, PA      . diltiazem (CARDIZEM) 1 mg/mL load via infusion 10 mg  10 mg Intravenous Once Bryan W Hager, PA-C       And  . diltiazem (CARDIZEM) 100 mg in dextrose 5 % 100 mL (1 mg/mL) infusion  5-15 mg/hr Intravenous Continuous Brett Canales, PA-C      . ondansetron [facility]) injection 4 mg  4 mg Intravenous Q6H PRN Almyra Deforest, PA      . sodium chloride 0.9 % injection 3 mL  3 mL Intravenous Q12H Almyra Deforest, PA      . sodium chloride 0.9 % injection 3 mL  3 mL Intravenous PRN Almyra Deforest, PA        Allergies:   Codeine    Social History:  The patient  reports that he has never smoked. He does not have any smokeless tobacco history on file. He reports that he drinks alcohol. He reports that he does not use illicit drugs.   Family History:  The patient's family history includes Heart disease in his father and maternal grandfather.    ROS:  Please see the history of present illness.   Otherwise, review of systems are positive for palpitation, dizziness, tachycardia.   All other systems are reviewed and negative.  PHYSICAL EXAM: VS:  BP 105/83 mmHg  Pulse 100  Temp(Src) 97.9 F (36.6 C) (Oral)  Resp 18  Ht 6\' 1"  (1.854 m)  Wt 178 lb 14.4 oz (81.149 kg)  BMI 23.61 kg/m2  SpO2 100% , BMI Body mass index is 23.61 kg/(m^2). GEN: Well nourished, well developed, in no acute distress HEENT: normal Neck: no JVD, carotid bruits, or masses Cardiac: irregularly irregular; no murmurs, rubs, or gallops,no edema  Respiratory:  clear to auscultation bilaterally, normal work of breathing GI: soft, nontender, nondistended, + BS MS: no deformity or atrophy Skin: warm and dry, no rash Neuro:  Strength and sensation are intact Psych: euthymic mood, full affect   EKG:  EKG is ordered today. The ekg ordered today demonstrates afib with RVR   Recent Labs: No results found for requested labs within last 365  days.    Lipid Panel    Component Value Date/Time   CHOL 167 08/19/2007 0950   TRIG 95 08/19/2007 0950   HDL 25.3* 08/19/2007 0950   CHOLHDL 6.6 CALC 08/19/2007 0950   VLDL 19 08/19/2007 0950   LDLCALC 123* 08/19/2007 0950      Wt Readings from Last 3 Encounters:  03/27/15 178 lb 14.4 oz (81.149 kg)  03/27/15 182 lb 6.4 oz (82.736 kg)  11/01/12 182 lb (82.555 kg)      Other studies Reviewed: Additional studies/ records that were reviewed today include: EKG today and yesterday.  Review of the above records demonstrates: Persistent afib with RVR with HR 180s   ASSESSMENT AND PLAN:  1.  Paroxysmal atrial fibrillation with rapid ventricular response  - Onset of symptom is either last Thursday of last Friday.  - Patient has been seen along with Dr. Tamala Julian, who agreed to admit to cardiology service at [facility]. We'll obtain TSH. Start IV diltiazem and IV hepairn. Plan for TEE DCCV tomorrow.   - CHA2DS2-Vasc score of 0. Post DCCV, may need a rate control med if his BP can tolerates. Likely need systemic anticoagulation for 1 month, after which may change to ASA.   - will hold off obtaining TTE since he will get TEE  - start 122ml/HR to increase BP  2. H/o esophageal stricture s/p balloon dilatation > 10 years ago, no sign of recurrent stricture.  3. Crohn's disease, no bleeding issue    Current medicines are reviewed at length with the patient today.  The patient does not have concerns regarding medicines.  The following changes have been made:  Admit to hospital today, start IV diltiazem and IV heparin  Labs/ tests ordered today include:   Orders Placed This Encounter  Procedures  . Portable chest x-ray 1 view  . APTT  . Basic metabolic panel  . CBC WITH DIFFERENTIAL  . Comprehensive metabolic panel  . Lipid panel  . Protime-INR  . Troponin I  . TSH  . Heparin level (unfractionated)  . CBC  . Diet NPO time specified Except for: Sips with Meds  . Diet  Heart Room service appropriate?: Yes; Fluid consistency:: Thin  . Diet NPO time specified Except for: Sips with Meds  . Diet NPO time specified Except for: Sips with Meds  . Informed consent details: transcribe and obtain patient signature  . Be certain prior charts and anticoagulation flow sheet are available with H&P & EKG   . Cardiac monitoring  . Echo machine on patient's right. Verify adequate EKG signal.  . Measure blood pressure  . Notify performing physician when  patient is ready for discharge  . Patient to wear a single hospital gown - Ask patient to remove dentures, if any  . Place patient in left lateral decubitus position with HOB at 45 degrees, pillow behind back  . Remove and safely store all jewelry.  Tape rings in place that cannot be removed.  . Verify informed consent  . Vital signs  . When patient fully awake discontinue Oxygen, change IV to saline lock and activity Ad Lib - Notify physician when completed  . RN may order Cardiology PRN Orders (through additional orders) for the following patient needs; indigestion (Maalox)), cough (Robitussin DM), constipation (MOM), diarrhea (Imodium), hemorrhoids (Tucks), or minor skin irritation (Hydrocortisone Cream)  . Nurse to provide smoking / tobacco cessation education  . Bed rest with bathroom privileges  . Notify physician  . Vital signs  . If diabetic or glucose greater than 140 mg/dl, notify MD for Glycemic Control (SSI) Order Set  . Initiate Clinical Pathway and Patient Education - Atrial Arrhythmia  . Maintain IV access or saline lock  . Target HR: 65-105 with return to baseline rhythm and hemodynamic stability  . Document Pasero Opioid-Induced Sedation Scale (POSS) per protocol (see sidebar report)  . Vital signs  . Vital signs post cardioversion  . Document by rhythm strip atrial fibrillation or atrial flutter.  If patient is in NSR, call MD  . If patient is a diabetic, place order for and obtain CBG  . If patient is  on dibigatran [facility]) or apixaban [facility]), verify that patient currently has taken 5 consecutive doses  . If patient is on rivaroxaban [facility]), verify that patient currently has taken 3 consecutive doses  . If patient is on warfarin (COUMADIN), place order for and obtain PT-INR and document INR is between 2-3  . If patient is taking diuretic or labs are older than 2 days, place order for and obtain BMET  . Patient to wear single hospital gown  . Provide equipment / supplies at bedside  . Remove and safely store all jewelry.  Tape rings in place that cannot be removed  . Shave left side of chest and back if necessary for pad placement  . Start new IV if blood or medications are infusing  . Verify informed consent  . Full code  . heparin per pharmacy consult  . Oxygen therapy Mode or (Route): Nasal cannula; Liters Per Minute: 2  . Pulse oximetry, continuous  . Oxygen therapy  . EKG 12-lead  . EKG 12-Lead  . TEE  . Insert and Maintain IV  . Place in observation (patient's expected length of stay will be less than 2 midnights)    See inpatient order set  Disposition:   FU with cardiology based on hospital workup  Weston Brass Almyra Deforest PA 03/27/2015 11:36 AM    [address], [place], [place]  [address] Phone: [phone]; Fax: [phone]

## 2015-03-27 NOTE — Progress Notes (Signed)
cr    Cardiology Office Note   Date:  03/27/2015   ID:  Daniel Orozco, DOB 1970-06-11, MRN [id number]  PCP:  Cathlean Cower, MD  Cardiologist:  New- Dr. Tamala Julian   Chief Complaint  Patient presents with  . Atrial Fibrillation    Dr. Tamala Julian, New onset afib      History of Present Illness: Alicia Dennie Rathsack is a 44 y.o. male with no past cardiac history presented for evaluation of insomnia onset atrial fibrillation with RVR. According to the patient, he has never seen a cardiologist before. He has no family history of cardiac illness. He does have history of alcohol abuse drink roughly 2-3 beers per day. He has history of esophageal stricture s/p balloon dilatation > 10 yrs ago. He also has a history of Crohn's disease. 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 He has also been under tremendous amount of stress at work. Otherwise he denies any recent fever or cough.  He started having palpitations and intermittent dizziness last Friday on 03/23/2015 at work. He went home early that day. He continued to have palpitation feeling throughout the weekend. He eventually sought medical attention at local urgent care on 03/26/2015 who asked him to take 325 mg aspirin every 4 hours. He presents is today for cardiology evaluation, EKG shows is currently in atrial fibrillation with RVR with heart rate 180s. His blood pressure is 98/80. He denies any current symptom of dizziness or feeling of passing out. He denies any thyroid history.    Past Medical History  Diagnosis Date  . Arthritis   . S/P balloon dilatation of esophageal stricture   . Crohn disease ([place])     in remission since 2000    Past Surgical History  Procedure Laterality Date  . Fracture surgery    . Esophageal dilation       Current Outpatient Prescriptions  Medication Sig Dispense Refill  . aspirin EC 81 MG tablet Take 81 mg by mouth daily.    Marland Kitchen ibuprofen (ADVIL,MOTRIN) 200 MG tablet  Take 200 mg by mouth every morning.     No current facility-administered medications for this visit.    Allergies:   Codeine    Social History:  The patient  reports that he has never smoked. He does not have any smokeless tobacco history on file. He reports that he drinks alcohol. He reports that he does not use illicit drugs.   Family History:  The patient's family history includes Heart disease in his father and maternal grandfather.    ROS:  Please see the history of present illness.   Otherwise, review of systems are positive for palpitation, dizziness, tachycardia.   All other systems are reviewed and negative.    PHYSICAL EXAM: VS:  BP 98/80 mmHg  Pulse 62  Ht 6\' 1"  (1.854 m)  Wt 182 lb 6.4 oz (82.736 kg)  BMI 24.07 kg/m2  SpO2 94% , BMI Body mass index is 24.07 kg/(m^2). GEN: Well nourished, well developed, in no acute distress HEENT: normal Neck: no JVD, carotid bruits, or masses Cardiac: irregularly irregular; no murmurs, rubs, or gallops,no edema  Respiratory:  clear to auscultation bilaterally, normal work of breathing GI: soft, nontender, nondistended, + BS MS: no deformity or atrophy Skin: warm and dry, no rash Neuro:  Strength and sensation are intact Psych: euthymic mood, full affect   EKG:  EKG is ordered today. The ekg ordered today demonstrates afib with RVR  Recent Labs: No results found for requested labs within last 365 days.    Lipid Panel    Component Value Date/Time   CHOL 167 08/19/2007 0950   TRIG 95 08/19/2007 0950   HDL 25.3* 08/19/2007 0950   CHOLHDL 6.6 CALC 08/19/2007 0950   VLDL 19 08/19/2007 0950   LDLCALC 123* 08/19/2007 0950      Wt Readings from Last 3 Encounters:  03/27/15 182 lb 6.4 oz (82.736 kg)  11/01/12 182 lb (82.555 kg)  12/18/09 197 lb 4 oz (89.472 kg)      Other studies Reviewed: Additional studies/ records that were reviewed today include: EKG today and yesterday.  Review of the above records  demonstrates: Persistent afib with RVR with HR 180s   ASSESSMENT AND PLAN:  1.  Paroxysmal atrial fibrillation with rapid ventricular response  - Onset of symptom is either last Thursday of last Friday.  - Patient has been seen along with Dr. Tamala Julian, who agreed to admit to cardiology service at [facility]. We'll obtain TSH. Start IV diltiazem and IV hepairn. Plan for TEE DCCV tomorrow.   - CHA2DS2-Vasc score of 0. Post DCCV, may need a rate control med if his BP can tolerates. Likely need systemic anticoagulation for 1 month, after which may change to ASA.   - will hold off obtaining TTE since he will get TEE  2. H/o esophageal stricture s/p balloon dilatation > 10 years ago, no sign of recurrent stricture.  3. Crohn's disease, no bleeding issue   Current medicines are reviewed at length with the patient today.  The patient does not have concerns regarding medicines.  The following changes have been made:  Admit to hospital today, start IV diltiazem and IV heparin  Labs/ tests ordered today include:  No orders of the defined types were placed in this encounter.    See inpatient order set  Disposition:   FU with cardiology based on hospital workup  Hilbert Corrigan PA 03/27/2015 10:04 AM    [address], [place], [place]  [address] Phone: [phone]; Fax: [phone]

## 2015-03-27 NOTE — Plan of Care (Signed)
Problem: Education: Goal: Knowledge of [facility] Education information/materials will improve Outcome: Completed/Met Date Met:  03/27/15 Pt and wife educated

## 2015-03-27 NOTE — Progress Notes (Signed)
ANTICOAGULATION CONSULT NOTE - Initial Consult  Pharmacy Consult for heparin gtt Indication: atrial fibrillation  Allergies  Allergen Reactions  . Codeine     REACTION: upsets stomach    Patient Measurements: Height: 6\' 1"  (185.4 cm) Weight: 178 lb 14.4 oz (81.149 kg) IBW/kg (Calculated) : 79.9 Heparin Dosing Weight: 81.1 kg  Vital Signs: Temp: 97.9 F (36.6 C) (12/13 1042) Temp Source: Oral (12/13 1042) BP: 105/83 mmHg (12/13 1042) Pulse Rate: 100 (12/13 1042)  Labs: No results for input(s): HGB, HCT, PLT, APTT, LABPROT, INR, HEPARINUNFRC, CREATININE, CKTOTAL, CKMB, TROPONINI in the last 72 hours.  CrCl cannot be calculated (Patient has no serum creatinine result on file.).   Medical History: Past Medical History  Diagnosis Date  . Arthritis   . S/P balloon dilatation of esophageal stricture   . Crohn disease ([place])     in remission since 2000    Medications:  Prescriptions prior to admission  Medication Sig Dispense Refill Last Dose  . aspirin EC 81 MG tablet Take 81 mg by mouth daily.   Taking  . ibuprofen (ADVIL,MOTRIN) 200 MG tablet Take 200 mg by mouth every morning.   Taking    Assessment: 44 yo with new onset pAFib with RVR to 190s. Plans to undergo DCCV tomorrow. Will start heparin gtt. Baseline CBC and renal fx wnl.   Goal of Therapy:  Heparin level 0.3-0.7 units/ml Monitor platelets by anticoagulation protocol: Yes    Plan:  -Heparin bolus 4000 units x1 then 1150 units/hr -Daily HL, CBC -Check 6 hour level -Monitor s/sx bleeding  -F/u plans for PO anticoag   Harvel Quale 03/27/2015,11:08 AM

## 2015-03-28 ENCOUNTER — Encounter (HOSPITAL_COMMUNITY): Payer: Self-pay | Admitting: *Deleted

## 2015-03-28 ENCOUNTER — Observation Stay (HOSPITAL_COMMUNITY): Payer: 59 | Admitting: Anesthesiology

## 2015-03-28 ENCOUNTER — Ambulatory Visit (HOSPITAL_COMMUNITY): Admission: RE | Admit: 2015-03-28 | Payer: 59 | Source: Ambulatory Visit | Admitting: Cardiology

## 2015-03-28 ENCOUNTER — Encounter (HOSPITAL_COMMUNITY)
Admission: AD | Disposition: A | Discharge status: Home / Self Care | Payer: Self-pay | Source: Ambulatory Visit | Attending: Interventional Cardiology

## 2015-03-28 ENCOUNTER — Observation Stay (HOSPITAL_BASED_OUTPATIENT_CLINIC_OR_DEPARTMENT_OTHER)
Admission: AD | Admit: 2015-03-28 | Discharge: 2015-03-28 | Disposition: A | Discharge status: Home / Self Care | Payer: 59 | Source: Ambulatory Visit | Attending: Physician Assistant | Admitting: Physician Assistant

## 2015-03-28 DIAGNOSIS — Z885 Allergy status to narcotic agent status: Secondary | ICD-10-CM | POA: Diagnosis not present

## 2015-03-28 DIAGNOSIS — K5090 Crohn's disease, unspecified, without complications: Secondary | ICD-10-CM | POA: Diagnosis not present

## 2015-03-28 DIAGNOSIS — I4891 Unspecified atrial fibrillation: Secondary | ICD-10-CM

## 2015-03-28 DIAGNOSIS — I480 Paroxysmal atrial fibrillation: Secondary | ICD-10-CM | POA: Diagnosis not present

## 2015-03-28 DIAGNOSIS — Z23 Encounter for immunization: Secondary | ICD-10-CM | POA: Diagnosis not present

## 2015-03-28 HISTORY — PX: TEE WITHOUT CARDIOVERSION: SHX5443

## 2015-03-28 HISTORY — PX: CARDIOVERSION: SHX1299

## 2015-03-28 LAB — BASIC METABOLIC PANEL
ANION GAP: 6 (ref 5–15)
BUN: 9 mg/dL (ref 6–20)
CALCIUM: 8.7 mg/dL — AB (ref 8.9–10.3)
CO2: 25 mmol/L (ref 22–32)
Chloride: 109 mmol/L (ref 101–111)
Creatinine, Ser: 0.89 mg/dL (ref 0.61–1.24)
GFR calc Af Amer: >60 mL/min (ref >60)
GFR calc non Af Amer: >60 mL/min (ref >60)
GLUCOSE: 109 mg/dL — AB (ref 65–99)
POTASSIUM: 4 mmol/L (ref 3.5–5.1)
Sodium: 140 mmol/L (ref 135–145)

## 2015-03-28 LAB — HEPARIN LEVEL (UNFRACTIONATED)
HEPARIN UNFRACTIONATED: 0.24 [IU]/mL — AB (ref 0.30–0.70)
Heparin Unfractionated: 0.26 IU/mL — ABNORMAL LOW (ref 0.30–0.70)

## 2015-03-28 LAB — CBC
HCT: 41.5 % (ref 39.0–52.0)
Hemoglobin: 14.7 g/dL (ref 13.0–17.0)
MCH: 31.1 pg (ref 26.0–34.0)
MCHC: 35.4 g/dL (ref 30.0–36.0)
MCV: 87.7 fL (ref 78.0–100.0)
Platelets: 255 10*3/uL (ref 150–400)
RBC: 4.73 MIL/uL (ref 4.22–5.81)
RDW: 12.4 % (ref 11.5–15.5)
WBC: 6.4 10*3/uL (ref 4.0–10.5)

## 2015-03-28 LAB — LIPID PANEL
CHOL/HDL RATIO: 5 ratio
Cholesterol: 144 mg/dL (ref 0–200)
HDL: 29 mg/dL — ABNORMAL LOW (ref >40)
LDL Cholesterol: 93 mg/dL (ref 0–99)
Triglycerides: 110 mg/dL (ref <150)
VLDL: 22 mg/dL (ref 0–40)

## 2015-03-28 SURGERY — ECHOCARDIOGRAM, TRANSESOPHAGEAL
Anesthesia: Monitor Anesthesia Care

## 2015-03-28 MED ORDER — LACTATED RINGERS IV SOLN
INTRAVENOUS | Status: DC | PRN
  Administered 2015-03-28: 12:00:00 via INTRAVENOUS

## 2015-03-28 MED ORDER — IBUPROFEN 200 MG PO TABS: 400.0000 mg | ORAL_TABLET | Freq: Two times a day (BID) | ORAL | Status: DC | PRN

## 2015-03-28 MED ORDER — LIDOCAINE HCL (CARDIAC) 20 MG/ML IV SOLN
INTRAVENOUS | Status: DC | PRN
  Administered 2015-03-28: 50 mg via INTRAVENOUS

## 2015-03-28 MED ORDER — SODIUM CHLORIDE 0.9 % IV SOLN: INTRAVENOUS | Status: DC

## 2015-03-28 MED ORDER — APIXABAN 5 MG PO TABS
5.0000 mg | ORAL_TABLET | Freq: Two times a day (BID) | ORAL | Status: DC
  Administered 2015-03-28: 5 mg via ORAL
  Filled 2015-03-28: qty 1

## 2015-03-28 MED ORDER — METOPROLOL SUCCINATE ER 25 MG PO TB24: 25.0000 mg | ORAL_TABLET | Freq: Every day | ORAL | Status: DC

## 2015-03-28 MED ORDER — PROPOFOL 500 MG/50ML IV EMUL
INTRAVENOUS | Status: DC | PRN
  Administered 2015-03-28: 100 ug/kg/min via INTRAVENOUS

## 2015-03-28 MED ORDER — APIXABAN 5 MG PO TABS: 5.0000 mg | ORAL_TABLET | Freq: Two times a day (BID) | ORAL | Status: DC

## 2015-03-28 MED ORDER — OXYCODONE HCL 5 MG PO TABS: 5.0000 mg | ORAL_TABLET | Freq: Once | ORAL | Status: DC | PRN

## 2015-03-28 MED ORDER — METOPROLOL SUCCINATE ER 25 MG PO TB24
25.0000 mg | ORAL_TABLET | Freq: Every day | ORAL | Status: DC
  Administered 2015-03-28: 25 mg via ORAL
  Filled 2015-03-28: qty 1

## 2015-03-28 MED ORDER — OXYCODONE HCL 5 MG/5ML PO SOLN: 5.0000 mg | Freq: Once | ORAL | Status: DC | PRN

## 2015-03-28 MED ORDER — ONDANSETRON HCL 4 MG/2ML IJ SOLN: 4.0000 mg | Freq: Four times a day (QID) | INTRAMUSCULAR | Status: DC | PRN

## 2015-03-28 MED ORDER — ASPIRIN EC 325 MG PO TBEC: 325.0000 mg | DELAYED_RELEASE_TABLET | ORAL | Status: DC

## 2015-03-28 MED ORDER — BUTAMBEN-TETRACAINE-BENZOCAINE 2-2-14 % EX AERO
INHALATION_SPRAY | CUTANEOUS | Status: DC | PRN
  Administered 2015-03-28: 2 via TOPICAL

## 2015-03-28 MED ORDER — FENTANYL CITRATE (PF) 100 MCG/2ML IJ SOLN: 25.0000 ug | INTRAMUSCULAR | Status: DC | PRN

## 2015-03-28 MED ORDER — PROPOFOL 10 MG/ML IV BOLUS
INTRAVENOUS | Status: DC | PRN
  Administered 2015-03-28: 50 mg via INTRAVENOUS
  Administered 2015-03-28 (×3): 20 mg via INTRAVENOUS
  Administered 2015-03-28: 30 mg via INTRAVENOUS
  Administered 2015-03-28: 50 mg via INTRAVENOUS

## 2015-03-28 NOTE — Interval H&P Note (Signed)
History and Physical Interval Note:  03/28/2015 10:25 AM  Daniel Orozco  has presented today for surgery, with the diagnosis of AFIB  The various methods of treatment have been discussed with the patient and family. After consideration of risks, benefits and other options for treatment, the patient has consented to  Procedure(s): TRANSESOPHAGEAL ECHOCARDIOGRAM (TEE) (N/A) CARDIOVERSION (N/A) as a surgical intervention .  The patient's history has been reviewed, patient examined, no change in status, stable for surgery.  I have reviewed the patient's chart and labs.  Questions were answered to the patient's satisfaction.     Dorothy Spark

## 2015-03-28 NOTE — Transfer of Care (Signed)
Immediate Anesthesia Transfer of Care Note  Patient: Daniel Orozco  Procedure(s) Performed: Procedure(s): TRANSESOPHAGEAL ECHOCARDIOGRAM (TEE) (N/A) CARDIOVERSION (N/A)  Patient Location: PACU  Anesthesia Type:MAC  Level of Consciousness: awake, alert , oriented, patient cooperative and responds to stimulation  Airway & Oxygen Therapy: Patient Spontanous Breathing and Patient connected to nasal cannula oxygen  Post-op Assessment: Report given to RN and Post -op Vital signs reviewed and stable  Post vital signs: Reviewed and stable  Last Vitals:  Filed Vitals:   03/28/15 1255 03/28/15 1305  BP: 103/80 99/79  Pulse: 82 73  Temp:    Resp: 18 15    Complications: No apparent anesthesia complications

## 2015-03-28 NOTE — Care Management Note (Signed)
Case Management Note  Patient Details  Name: Daniel Orozco MRN: [id number] Date of Birth: [birth year]  Subjective/Objective: Pt admitted for A fib. Plan for d/c on Eliquis.                    Action/Plan:CM did provide pt with 30 day free and co pay card for Eliquis. [facility] uses [facility] [employer] on [address]. Medication is not available. CM did call [facility] on [address] and medication is available. Pt will need Rx for 30 day free no refills. No further needs from CM at this time.   Expected Discharge Date:                  Expected Discharge Plan:  Home/Self Care  In-House Referral:  NA  Discharge planning Services  CM Consult, Medication Assistance  Post Acute Care Choice:  NA Choice offered to:  NA  DME Arranged:  N/A DME Agency:  NA  HH Arranged:  NA HH Agency:  NA  Status of Service:  Completed, signed off  Medicare Important Message Given:    Date Medicare IM Given:    Medicare IM give by:    Date Additional Medicare IM Given:    Additional Medicare Important Message give by:     If discussed at [place] of Stay Meetings, dates discussed:    Additional Comments:  Bethena Roys, RN 03/28/2015, 2:44 PM

## 2015-03-28 NOTE — Anesthesia Preprocedure Evaluation (Signed)
Anesthesia Evaluation  Patient identified by MRN, date of birth, ID band Patient awake    Reviewed: Allergy & Precautions, NPO status , Patient's Chart, lab work & pertinent test results  Airway Mallampati: II   Neck ROM: full    Dental   Pulmonary asthma , former smoker,    breath sounds clear to auscultation       Cardiovascular negative cardio ROS  + dysrhythmias Atrial Fibrillation  Rhythm:regular Rate:Normal     Neuro/Psych  Neuromuscular disease    GI/Hepatic GERD  ,  Endo/Other    Renal/GU      Musculoskeletal  (+) Arthritis ,   Abdominal   Peds  Hematology   Anesthesia Other Findings   Reproductive/Obstetrics                             Anesthesia Physical Anesthesia Plan  ASA: II  Anesthesia Plan: MAC   Post-op Pain Management:    Induction: Intravenous  Airway Management Planned: Nasal Cannula  Additional Equipment:   Intra-op Plan:   Post-operative Plan:   Informed Consent: I have reviewed the patients History and Physical, chart, labs and discussed the procedure including the risks, benefits and alternatives for the proposed anesthesia with the patient or authorized representative who has indicated his/her understanding and acceptance.     Plan Discussed with: CRNA, Anesthesiologist and Surgeon  Anesthesia Plan Comments:         Anesthesia Quick Evaluation

## 2015-03-28 NOTE — CV Procedure (Signed)
     Transesophageal Echocardiogram Note  Daniel Orozco [id number] February 09, 1971  Procedure: Transesophageal Echocardiogram Indications: atrial fibrillation  Procedure Details Consent: Obtained Time Out: Verified patient identification, verified procedure, site/side was marked, verified correct patient position, special equipment/implants available, Radiology Safety Procedures followed,  medications/allergies/relevent history reviewed, required imaging and test results available.  Performed  Medications: Fiv propofol 280 mg  Left Ventrical:  Normal size, no LVH, LVEF 55-60%  Mitral Valve: mild MR  Aortic Valve: normal  Tricuspid Valve: trivial TR, normal RVSP  Pulmonic Valve: mild PR  Left Atrium/ Left atrial appendage: no thrombus, normal emptying velocities  Atrial septum: no ASD or PFO  Aorta: no plaque  Complications: No apparent complications Patient did tolerate procedure well.  Dorothy Spark, MD, [facility] 03/28/2015, 10:25 AM      Cardioversion Note  Daniel Orozco [id number] 1970/06/14  Procedure: DC Cardioversion Indications: atrial fibrillaton  Procedure Details Consent: Obtained Time Out: Verified patient identification, verified procedure, site/side was marked, verified correct patient position, special equipment/implants available, Radiology Safety Procedures followed,  medications/allergies/relevent history reviewed, required imaging and test results available.  Performed  The patient has been on adequate anticoagulation.  The patient received IV propofol 280 mg for sedation.  Synchronous cardioversion was performed at 120 joules.  The cardioversion was successful.   Complications: No apparent complications Patient did tolerate procedure well.   Dorothy Spark, MD, [facility] 03/28/2015, 10:25 AM

## 2015-03-28 NOTE — Discharge Instructions (Signed)
Atrial Fibrillation °Atrial fibrillation is a type of heartbeat that is irregular or fast (rapid). If you have this condition, your heart keeps quivering in a weird (chaotic) way. This condition can make it so your heart cannot pump blood normally. Having this condition gives a person more risk for stroke, heart failure, and other heart problems. There are different types of atrial fibrillation. Talk with your doctor to learn about the type that you have. °HOME CARE °· Take over-the-counter and prescription medicines only as told by your doctor. °· If your doctor prescribed a blood-thinning medicine, take it exactly as told. Taking too much of it can cause bleeding. If you do not take enough of it, you will not have the protection that you need against stroke and other problems. °· Do not use any tobacco products. These include cigarettes, chewing tobacco, and e-cigarettes. If you need help quitting, ask your doctor. °· If you have apnea (obstructive sleep apnea), manage it as told by your doctor. °· Do not drink alcohol. °· Do not drink beverages that have caffeine. These include coffee, soda, and tea. °· Maintain a healthy weight. Do not use diet pills unless your doctor says they are safe for you. Diet pills may make heart problems worse. °· Follow diet instructions as told by your doctor. °· Exercise regularly as told by your doctor. °· Keep all follow-up visits as told by your doctor. This is important. °GET HELP IF: °· You notice a change in the speed, rhythm, or strength of your heartbeat. °· You are taking a blood-thinning medicine and you notice more bruising. °· You get tired more easily when you move or exercise. °GET HELP RIGHT AWAY IF: °· You have pain in your chest or your belly (abdomen). °· You have sweating or weakness. °· You feel sick to your stomach (nauseous). °· You notice blood in your throw up (vomit), poop (stool), or pee (urine). °· You are short of breath. °· You suddenly have swollen feet  and ankles. °· You feel dizzy. °· Your suddenly get weak or numb in your face, arms, or legs, especially if it happens on one side of your body. °· You have trouble talking, trouble understanding, or both. °· Your face or your eyelid droops on one side. °These symptoms may be an emergency. Do not wait to see if the symptoms will go away. Get medical help right away. Call your local emergency services (911 in the [place] Do not drive yourself to the hospital. °  °This information is not intended to replace advice given to you by your health care provider. Make sure you discuss any questions you have with your health care provider. °  °Document Released: 01/08/2008 Document Revised: 12/20/2014 Document Reviewed: 07/26/2014 °Elsevier Interactive Patient Education ©2016 [employer] ° °

## 2015-03-28 NOTE — Progress Notes (Signed)
Patient being discharged home per MD order, All discharge instructions gone over with patient and wife.

## 2015-03-28 NOTE — Progress Notes (Addendum)
ANTICOAGULATION CONSULT NOTE - Follow-up Consult  Pharmacy Consult for heparin Indication: atrial fibrillation  Allergies  Allergen Reactions  . Codeine Other (See Comments)    REACTION: upsets stomach  . Other Swelling    Honey dew melon causes throat swelling    Patient Measurements: Height: 6\' 1"  (185.4 cm) Weight: 178 lb 14.4 oz (81.149 kg) IBW/kg (Calculated) : 79.9 Heparin Dosing Weight: 81.1 kg  Vital Signs: Temp: 98.2 F (36.8 C) (12/13 2357) Temp Source: Oral (12/13 2357) BP: 103/68 mmHg (12/13 2357) Pulse Rate: 104 (12/13 2357)  Labs:  Recent Labs  03/27/15 1152 03/27/15 1620 03/27/15 2054 03/27/15 2253 03/28/15 0229  HGB 15.3  --   --   --  14.7  HCT 43.5  --   --   --  41.5  PLT 298  --   --   --  255  APTT 26  --   --   --   --   LABPROT 14.4  --   --   --   --   INR 1.10  --   --   --   --   HEPARINUNFRC  --   --  <0.10*  --  0.26*  CREATININE 0.97  --   --   --  0.89  TROPONINI <0.03 <0.03  --  <0.03  --     Estimated Creatinine Clearance: 118.5 mL/min (by C-G formula based on Cr of 0.89).   Assessment: 44 yo on heparin for new onset afib. Plans to undergo DCCV today. Heparin level slightly subtherapeutic (0.26) on 1350 units/hr. CBC stable. No issues with line or bleeding reported per RN.  Goal of Therapy:  Heparin level 0.3-0.7 units/ml Monitor platelets by anticoagulation protocol: Yes    Plan:  -Increase heparin to 1500 units/hr -F/u 6 hr heparin level -F/u plans for PO anticoag  Sherlon Handing, PharmD, BCPS Clinical pharmacist, pager [phone]  03/28/2015,4:34 AM

## 2015-03-28 NOTE — Progress Notes (Signed)
Patient Name: Daniel Orozco Date of Encounter: 03/28/2015   SUBJECTIVE  Feeling well. No chest pain, sob or palpitations.  For TEE/DCCV today.   CURRENT MEDS . Influenza vac split quadrivalent PF  0.5 mL Intramuscular Tomorrow-1000  . sodium chloride  3 mL Intravenous Q12H   . sodium chloride 100 mL/hr at 03/27/15 2248  . diltiazem (CARDIZEM) infusion 12.5 mg/hr (03/28/15 0900)  . heparin 1,500 Units/hr (03/28/15 0900)    OBJECTIVE  Filed Vitals:   03/27/15 1700 03/27/15 1950 03/27/15 2357 03/28/15 0536  BP:  110/78 103/68 112/67  Pulse:  59 104 100  Temp: 97.6 F (36.4 C) 98.2 F (36.8 C) 98.2 F (36.8 C) 97.9 F (36.6 C)  TempSrc: Oral Oral Oral Oral  Resp:  18 16   Height:      Weight:    183 lb 6.4 oz (83.19 kg)  SpO2:  98% 97% 98%    Intake/Output Summary (Last 24 hours) at 03/28/15 1033 Last data filed at 03/28/15 0900  Gross per 24 hour  Intake 4090.43 ml  Output      3 ml  Net 4087.43 ml   Filed Weights   03/27/15 1042 03/28/15 0536  Weight: 178 lb 14.4 oz (81.149 kg) 183 lb 6.4 oz (83.19 kg)    PHYSICAL EXAM  General: Pleasant, NAD. Neuro: Alert and oriented X 3. Moves all extremities spontaneously. Psych: Normal affect. HEENT:  Normal  Neck: Supple without bruits or JVD. Lungs:  Resp regular and unlabored, CTA. Heart: Ir IR no s3, s4, or murmurs. Abdomen: Soft, non-tender, non-distended, BS + x 4.  Extremities: No clubbing, cyanosis or edema. DP/PT/Radials 2+ and equal bilaterally.  Accessory Clinical Findings  CBC  Recent Labs  03/27/15 1152 03/28/15 0229  WBC 5.8 6.4  NEUTROABS 4.2  --   HGB 15.3 14.7  HCT 43.5 41.5  MCV 87.3 87.7  PLT 298 123456   Basic Metabolic Panel  Recent Labs  03/27/15 1152 03/28/15 0229  NA 140 140  K 4.6 4.0  CL 106 109  CO2 27 25  GLUCOSE 139* 109*  BUN 10 9  CREATININE 0.97 0.89  CALCIUM 9.4 8.7*   Liver Function Tests  Recent Labs  03/27/15 1152  AST 19  ALT 28  ALKPHOS 56    BILITOT 1.5*  PROT 6.4*  ALBUMIN 3.8   No results for input(s): LIPASE, AMYLASE in the last 72 hours. Cardiac Enzymes  Recent Labs  03/27/15 1152 03/27/15 1620 03/27/15 2253  TROPONINI <0.03 <0.03 <0.03   Fasting Lipid Panel  Recent Labs  03/28/15 0229  CHOL 144  HDL 29*  LDLCALC 93  TRIG 110  CHOLHDL 5.0   Thyroid Function Tests  Recent Labs  03/27/15 1152  TSH 1.048    TELE  Afib   Radiology/Studies  Portable Chest X-ray 1 View  03/27/2015  CLINICAL DATA:  Atrial fibrillation with rapid ventricular response. EXAM: PORTABLE CHEST 1 VIEW COMPARISON:  09/14/2003 FINDINGS: The heart size and mediastinal contours are within normal limits. Both lungs are clear. No pleural effusion or pneumothorax. The visualized skeletal structures are unremarkable. IMPRESSION: No active disease. Electronically Signed   By: Lajean Manes M.D.   On: 03/27/2015 11:31    ASSESSMENT AND PLAN  1. Atrial fibrillation with RVR (HCC) - CHA2DS2-Vasc score of 0.  Currently on Iv dilt and IV heparin. For TEE/DCCV.  - Post DCCV, may need a rate control med if his BP can tolerates. Likely need systemic anticoagulation  for 1 month, after which may change to ASA.  - BP stable this morning. TSH normal.   2. H/o esophageal stricture s/p balloon dilatation > 10 years ago, no sign of recurrent stricture.  3. Crohn's disease, no bleeding issue   Signed, Bhagat,Bhavinkumar PA-C Pager [phone]   Patient seen and examined. Agree with assessment and plan. TEE just completed; NSR at 90 bpm.   Feels much better. Discussed avoidance of caffeine and pseudoephedrine. With snoring history consider sleep study as outpatient. Would start BB or oral diltiazem and dc iv cardizem.   Troy Sine, MD, [facility] 03/28/2015 1:19 PM

## 2015-03-28 NOTE — Discharge Summary (Signed)
Discharge Summary   Patient ID: Daniel Orozco,  MRN: [id number], DOB/AGE: 45-Feb-1972 45 y.o.  Admit date: 03/27/2015 Discharge date: 03/28/2015  Primary Care Provider: Cathlean Cower Primary Cardiologist: New- Dr. Tamala Julian   Discharge Diagnoses Active Problems:  Atrial fibrillation with RVR [facility])  esophageal stricture s/p balloon dilatation > 10 yrs ago  Alcohol abuse  History of Crohn's disease  Stress   Allergies Allergies  Allergen Reactions  . Codeine Other (See Comments)    REACTION: upsets stomach  . Other Swelling    Honey dew melon causes throat swelling    Consultant: None  Procedures  Transesophageal Echocardiogram Left Ventrical: Normal size, no LVH, LVEF 55-60%  Mitral Valve: mild MR  Aortic Valve: normal  Tricuspid Valve: trivial TR, normal RVSP  Pulmonic Valve: mild PR  Left Atrium/ Left atrial appendage: no thrombus, normal emptying velocities  Atrial septum: no ASD or PFO  Aorta: no plaque  DC Cardioversion  The patient has been on adequate anticoagulation. The patient received IV propofol 280 mg for sedation. Synchronous cardioversion was performed at 120 joules.  The cardioversion was successful.  History of Present Illness  Daniel Orozco is a 44 y.o. male with no past cardiac history presented to office 03/27/15 for evaluation of new onset atrial fibrillation with RVR. According to the patient, he has never seen a cardiologist before. He has no family history of cardiac illness. He does have history of alcohol abuse drink roughly 2-3 beers per day. He has history of esophageal stricture s/p balloon dilatation > 10 yrs ago. He also has a history of Crohn's disease. No bleeding issue. 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 He has also been under tremendous amount of stress at work. Otherwise he denies any recent fever or cough.  He started having palpitations and intermittent dizziness last Friday on  03/23/2015 at work. He went home early that day. He continued to have palpitation feeling throughout the weekend. He eventually sought medical attention at local urgent care on 03/26/2015 who asked him to take 325 mg aspirin every 4 hours. He presents is today for cardiology evaluation, EKG showed atrial fibrillation with RVR with heart rate 180s. His blood pressure is 98/80. He denies any current symptom of dizziness or feeling of passing out. He denies any thyroid history. He was admitted directed for further management.   Hospital Course  Started IV diltiazem and IV heparin. Rate improved. He underwent TEE/DCCV which showed LV EF of 55-60%, no LVH, mild MR, trivial TR, normal RVSP, no thrombus, no ASD or PFO. S/p successful cardioversion at 120J. Maintained sinus rhythm.   She has been seen by Dr. Claiborne Billings  today and deemed ready for discharge home. All follow-up appointments have been scheduled. Discharge medications are listed below.   He will be discharged on Toprol XL 25. Plan to anticoagulate with Eliquis for 1 month and then stop. Discussed avoidance of caffeine and pseudoephedrine as likely etiology for afib. Advised to rest for 5 days. He is under lot of stress at work. Advised to develop techniques to relieve stress including deep breathing or yoga or daily exercise.   Discharge Vitals Blood pressure 109/71, pulse 89, temperature 98.4 F (36.9 C), temperature source Oral, resp. rate 16, height 6\' 1"  (1.854 m), weight 183 lb 6.4 oz (83.19 kg), SpO2 100 %.  Filed Weights   03/27/15 1042 03/28/15 0536  Weight: 178 lb 14.4 oz (81.149 kg) 183 lb 6.4 oz (  83.19 kg)    Labs  CBC  Recent Labs  03/27/15 1152 03/28/15 0229  WBC 5.8 6.4  NEUTROABS 4.2  --   HGB 15.3 14.7  HCT 43.5 41.5  MCV 87.3 87.7  PLT 298 123456   Basic Metabolic Panel  Recent Labs  03/27/15 1152 03/28/15 0229  NA 140 140  K 4.6 4.0  CL 106 109  CO2 27 25  GLUCOSE 139* 109*  BUN 10 9  CREATININE 0.97 0.89    CALCIUM 9.4 8.7*   Liver Function Tests  Recent Labs  03/27/15 1152  AST 19  ALT 28  ALKPHOS 56  BILITOT 1.5*  PROT 6.4*  ALBUMIN 3.8   No results for input(s): LIPASE, AMYLASE in the last 72 hours. Cardiac Enzymes  Recent Labs  03/27/15 1152 03/27/15 1620 03/27/15 2253  TROPONINI <0.03 <0.03 <0.03  Fasting Lipid Panel  Recent Labs  03/28/15 0229  CHOL 144  HDL 29*  LDLCALC 93  TRIG 110  CHOLHDL 5.0   Thyroid Function Tests  Recent Labs  03/27/15 1152  TSH 1.048    Disposition  Pt is being discharged home today in good condition.  Follow-up Plans & Appointments  Follow-up Information    Follow up with Charlie Pitter, PA-C. Go on 04/17/2015.   Specialties:  Cardiology, Radiology   Why:  @11 :00 post hospital   Contact information:   [address] [place] [address] [place] [address] [phone]           Discharge Instructions    Diet - low sodium heart healthy    Complete by:  As directed      Increase activity slowly    Complete by:  As directed            F/u Labs/Studies: None  Discharge Medications    Medication List    STOP taking these medications        aspirin EC 325 MG tablet     ibuprofen 200 MG tablet  Commonly known as:  ADVIL,MOTRIN      TAKE these medications        apixaban 5 MG Tabs tablet  Commonly known as:  ELIQUIS  Take 1 tablet (5 mg total) by mouth 2 (two) times daily.     metoprolol succinate 25 MG 24 hr tablet  Commonly known as:  TOPROL-XL  Take 1 tablet (25 mg total) by mouth daily.        Duration of Discharge Encounter   Greater than 30 minutes including physician time.  Signed, Koah Chisenhall PA-C 03/28/2015, 3:39 PM

## 2015-03-28 NOTE — Progress Notes (Signed)
  Echocardiogram Echocardiogram Transesophageal has been performed.  Daniel Orozco 03/28/2015, 12:35 PM

## 2015-03-29 ENCOUNTER — Encounter (HOSPITAL_COMMUNITY): Payer: Self-pay | Admitting: Cardiology

## 2015-03-29 NOTE — Anesthesia Postprocedure Evaluation (Signed)
Anesthesia Post Note  Patient: Daniel Orozco  Procedure(s) Performed: Procedure(s) (LRB): TRANSESOPHAGEAL ECHOCARDIOGRAM (TEE) (N/A) CARDIOVERSION (N/A)  Patient location during evaluation: PACU Anesthesia Type: MAC Level of consciousness: awake and alert Pain management: pain level controlled Vital Signs Assessment: post-procedure vital signs reviewed and stable Respiratory status: spontaneous breathing, nonlabored ventilation, respiratory function stable and patient connected to nasal cannula oxygen Cardiovascular status: stable and blood pressure returned to baseline Anesthetic complications: no    Last Vitals:  Filed Vitals:   03/28/15 1450 03/28/15 1537  BP: 109/71 118/75  Pulse: 89 85  Temp: 36.9 C   Resp: 16     Last Pain:  Filed Vitals:   03/28/15 1540  PainSc: 0-No pain                 Roxy Mastandrea S

## 2015-04-02 ENCOUNTER — Telehealth: Payer: Self-pay | Admitting: Interventional Cardiology

## 2015-04-02 ENCOUNTER — Encounter: Payer: Self-pay | Admitting: Physician Assistant

## 2015-04-02 MED ORDER — METOPROLOL SUCCINATE ER 25 MG PO TB24: 50.0000 mg | ORAL_TABLET | Freq: Every day | ORAL | Status: DC

## 2015-04-02 NOTE — Telephone Encounter (Signed)
Pt c/o Shortness Of Breath: STAT if SOB developed within the last 24 hours or pt is noticeably SOB on the phone  1. Are you currently SOB (can you hear that pt is SOB on the phone)? Yes, don't hear it on the phone  2. How long have you been experiencing SOB? yesterday  3. Are you SOB when sitting or when up moving around? both  4. Are you currently experiencing any other symptoms? Hr is 130-120/lightheaded

## 2015-04-02 NOTE — Telephone Encounter (Signed)
Reports recent new onset AFib. He was admitted last week for TEE/DCCV and started on NOAC and Toprol. Reports HRs still all over the place, jumping from 60s to 130s.  He is experiencing SOB and light headedness on and off. SBPs are ranging from low 90s to 130s.  Reviewed with Dr. Acie Fredrickson, DOD.  Order to increase Toprol to 50 mg, increase salt intake, f/u with flex tomorrow.   Patient will monitor his BPs & HRs Patient scheduled to see Richardson Dopp, PA tomorrow for follow up. Patient verbalized understanding and agreeable to plan.

## 2015-04-03 ENCOUNTER — Ambulatory Visit (INDEPENDENT_AMBULATORY_CARE_PROVIDER_SITE_OTHER): Payer: 59 | Admitting: Physician Assistant

## 2015-04-03 ENCOUNTER — Ambulatory Visit (INDEPENDENT_AMBULATORY_CARE_PROVIDER_SITE_OTHER): Payer: 59

## 2015-04-03 ENCOUNTER — Encounter: Payer: Self-pay | Admitting: Physician Assistant

## 2015-04-03 VITALS — BP 116/80 | HR 75 | Ht 73.0 in | Wt 182.2 lb

## 2015-04-03 DIAGNOSIS — I4891 Unspecified atrial fibrillation: Secondary | ICD-10-CM

## 2015-04-03 MED ORDER — METOPROLOL SUCCINATE ER 50 MG PO TB24: 50.0000 mg | ORAL_TABLET | Freq: Every day | ORAL | Status: DC

## 2015-04-03 NOTE — Progress Notes (Signed)
Cardiology Office Note   Date:  04/03/2015   ID:  Logen, Kyne [birth year], MRN [id number]  PCP:  Cathlean Cower, MD  Cardiologist:  Dr. Tamala Julian   Chief Complaint  Patient presents with  . Follow-up    see for Dr. Tamala Julian  . Palpitations  . Atrial Fibrillation      History of Present Illness: Daniel Orozco is a 44 y.o. male who presents for followup after recently diagnosed with new onset atrial fibrillation. According to the patient, he has never seen a cardiologist before. He has no family history of cardiac illness. He does have history of alcohol abuse drink roughly 2-3 beers per day. He has history of esophageal stricture s/p balloon dilatation > 10 yrs ago. He also has a history of Crohn's disease. 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 He has also been under tremendous amount of stress at work. Otherwise he denies any recent fever or cough. He was last seen by me on 03/27/2015, given his uncontrolled HR and borderline BP, he was admitted to the hospital. TSH was normal. He underwent successful TEE DCCV on 12/14 which showed normal EF 55-60%, mild MR, normal RVSP, no ASD/PFO, afterward, he was started on Toprol XL 25mg  and eliquis and discharged on the same day. Since discharge, he has had recurrent palpitation. His Toprol XL was increased to 50mg  daily which helped with his palpitation.    Past Medical History  Diagnosis Date  . Arthritis   . S/P balloon dilatation of esophageal stricture   . Crohn disease ([place])     in remission since 2000    Past Surgical History  Procedure Laterality Date  . Fracture surgery    . Esophageal dilation    . Tee without cardioversion N/A 03/28/2015    Procedure: TRANSESOPHAGEAL ECHOCARDIOGRAM (TEE);  Surgeon: Dorothy Spark, MD;  Location: [facility];  Service: Cardiovascular;  Laterality: N/A;  . Cardioversion N/A 03/28/2015    Procedure: CARDIOVERSION;  Surgeon: Dorothy Spark, MD;   Location: [facility] ENDOSCOPY;  Service: Cardiovascular;  Laterality: N/A;     Current Outpatient Prescriptions  Medication Sig Dispense Refill  . apixaban (ELIQUIS) 5 MG TABS tablet Take 1 tablet (5 mg total) by mouth 2 (two) times daily. 60 tablet 0  . metoprolol succinate (TOPROL-XL) 50 MG 24 hr tablet Take 1 tablet (50 mg total) by mouth daily. 90 tablet 3   No current facility-administered medications for this visit.    Allergies:   Codeine and Other    Social History:  The patient  reports that he has quit smoking. He has never used smokeless tobacco. He reports that he drinks alcohol. He reports that he does not use illicit drugs.   Family History:  The patient's family history includes Heart disease in his father and maternal grandfather.    ROS:  Please see the history of present illness.   Otherwise, review of systems are positive for recurrent palpitation.   All other systems are reviewed and negative.    PHYSICAL EXAM: VS:  BP 116/80 mmHg  Pulse 75  Ht 6\' 1"  (1.854 m)  Wt 182 lb 3.2 oz (82.645 kg)  BMI 24.04 kg/m2  SpO2 99% , BMI Body mass index is 24.04 kg/(m^2). GEN: Well nourished, well developed, in no acute distress HEENT: normal Neck: no JVD, carotid bruits, or masses Cardiac: RRR; no murmurs, rubs, or gallops,no edema  Respiratory:  clear to auscultation bilaterally,  normal work of breathing GI: soft, nontender, nondistended, + BS MS: no deformity or atrophy Skin: warm and dry, no rash Neuro:  Strength and sensation are intact Psych: euthymic mood, full affect   EKG:  EKG is ordered today. The ekg ordered today demonstrates NSR without significant ST-T wave changes   Recent Labs: 03/27/2015: ALT 28; TSH 1.048 03/28/2015: BUN 9; Creatinine, Ser 0.89; Hemoglobin 14.7; Platelets 255; Potassium 4.0; Sodium 140    Lipid Panel    Component Value Date/Time   CHOL 144 03/28/2015 0229   TRIG 110 03/28/2015 0229   HDL 29* 03/28/2015 0229   CHOLHDL 5.0  03/28/2015 0229   VLDL 22 03/28/2015 0229   LDLCALC 93 03/28/2015 0229      Wt Readings from Last 3 Encounters:  04/03/15 182 lb 3.2 oz (82.645 kg)  03/28/15 183 lb 6.4 oz (83.19 kg)  03/27/15 182 lb 6.4 oz (82.736 kg)      Other studies Reviewed: Additional studies/ records that were reviewed today include:   TEE 03/28/2015 LV EF: 55% -  60%  ------------------------------------------------------------------- Study Conclusions  - Left ventricle: The cavity size was normal. Wall thickness was normal. Systolic function was normal. The estimated ejection fraction was in the range of 55% to 60%. Wall motion was normal; there were no regional wall motion abnormalities. - Aortic valve: No evidence of vegetation. - Mitral valve: No evidence of vegetation. - Left atrium: No evidence of thrombus in the atrial cavity or appendage. - Right atrium: No evidence of thrombus in the atrial cavity or appendage. No evidence of thrombus in the appendage. - Atrial septum: No defect or patent foramen ovale was identified. Echo contrast study showed no right-to-left atrial level shunt, following an increase in RA pressure induced by provocative maneuvers. - Pulmonic valve: No evidence of vegetation.  Impressions:  - No cardiac source of emboli was indentified.The study was followed by a successful cardioversion.    Review of the above records demonstrates: recently diagnosed atrial fibrillation underwent successful TEE DCCV on 03/28/2015.    ASSESSMENT AND PLAN:  1.  Paroxysmal atrial fibrillation: CHA2DS2-Vasc score 0  - CHA2DS2-Vasc score 0. S/p TEE DCCV on 03/22/2015 which showed normal EF. TSH normal  - plan for Eliquis for 1 month, after which change to 81mg  ASA. He has been having palpitation since DCCV, not sure if it is recurrent afib  - palpitation decreased after metoprolol XL increased to 50mg  daily. Will continue on current dose. Obtain 1 week event  monitor and followup with Dayna as previously scheduled on 04/16/2014. If has recurrent afib, will start fleicainide. His BP does not allow significant uptitration of Toprol XL. I have given him 2 weeks work note for light duty  - if he does start on flecainide, he will need ETT (likely does not require Myoview as he never had angina symptom to suggest underlying CAD)  2. Alcohol abuse: discussed direct correlation between EtOH abuse and afib, need to avoid alcohol  3. H/o esophageal stricture s/p balloon dilatation  4. Crohn's disease, no bleeding issue   Current medicines are reviewed at length with the patient today.  The patient does not have concerns regarding medicines.  The following changes have been made:  no change  Labs/ tests ordered today include:   Orders Placed This Encounter  Procedures  . Cardiac event monitor  . EKG 12-Lead     Disposition:   FU with cardiology in 2 weeks  Hilbert Corrigan PA 04/03/2015 3:06 PM  [facility] [address], [place], [place]  [address] Phone: [phone]; Fax: [phone]

## 2015-04-03 NOTE — Patient Instructions (Signed)
Medication Instructions:  CONTINUE ON TOPROL XL 50 MG DAILY  Labwork: NONE  Testing/Procedures: Your physician has recommended that you wear an event monitor THIS NEEDS TO BE FOR 1 WEEK PER HAO MENG, PAC. Event monitors are medical devices that record the heart's electrical activity. Doctors most often [place] these monitors to diagnose arrhythmias. Arrhythmias are problems with the speed or rhythm of the heartbeat. The monitor is a small, portable device. You can wear one while you do your normal daily activities. This is usually used to diagnose what is causing palpitations/syncope (passing out).  Follow-Up: KEEP YOUR APPT WITH DAYNA DUNN, PAC  Any Other Special Instructions Will Be Listed Below (If Applicable). YOU HAVE BEEN ADVISED TO AVOID ALCOHOL AND CAFFEINE   If you need a refill on your cardiac medications before your next appointment, please call your pharmacy.

## 2015-04-04 ENCOUNTER — Encounter: Payer: Self-pay | Admitting: Physician Assistant

## 2015-04-11 ENCOUNTER — Encounter: Payer: Self-pay | Admitting: Physician Assistant

## 2015-04-12 ENCOUNTER — Other Ambulatory Visit: Payer: Self-pay | Admitting: Physician Assistant

## 2015-04-12 ENCOUNTER — Telehealth: Payer: Self-pay | Admitting: Cardiology

## 2015-04-12 ENCOUNTER — Telehealth: Payer: Self-pay | Admitting: Physician Assistant

## 2015-04-12 DIAGNOSIS — Z79899 Other long term (current) drug therapy: Secondary | ICD-10-CM

## 2015-04-12 DIAGNOSIS — Z5181 Encounter for therapeutic drug level monitoring: Secondary | ICD-10-CM

## 2015-04-12 MED ORDER — FLECAINIDE ACETATE 50 MG PO TABS: 50.0000 mg | ORAL_TABLET | Freq: Two times a day (BID) | ORAL | Status: DC

## 2015-04-12 NOTE — Telephone Encounter (Signed)
New message   Pt states since he has turned in his monitor he as been having issues with tachycardia    Patient c/o Palpitations:  High priority if patient c/o lightheadedness and shortness of breath.  1. How long have you been having palpitations? 04/11/15 IN THE MORNING  2. Are you currently experiencing lightheadedness and shortness of breath?  BOTH  3. Have you checked your BP and heart rate? (document readings) HR    145   76                          BP   125/76    97/65  4. Are you experiencing any other symptoms? NO

## 2015-04-12 NOTE — Telephone Encounter (Signed)
Appt made for Afib clinic tomorrow at 10:00.  ETT scheduled for 10:00 on 04/19/15  at [facility]. I spoke with pt and gave him this information. Pt given directions and [facility]. Pt made aware that ETT is being done at hospital.  I verbally went over instructions with pt for stress test but told him to ask at appt in Afib clinic tomorrow if he will need to hold any medications the day of the stress test.  Appt with Melina Copa, PA cancelled and follow up to be determined at afib clinic appt.

## 2015-04-12 NOTE — Telephone Encounter (Signed)
Received call transferred from operator and spoke with pt. He reports he turned heart monitor in on Tuesday. Went to work yesterday. While sitting at computer at work felt heart racing. Ranged from 76-145. This continued most of the day. Felt dizzy and short of breath with this. States his job is physically and mentally demanding.  Went to urgent care at [facility] last night and was in SR with heart rate of 65.  Pt reports heart rate was 118 on way to clinic.  He reports today his heart rate is 65 but he is not doing any activity.  States heart rate goes up with activity.  Is taking metoprolol and Eliquis as listed. Pt states episodes did not start until after he returned monitor. Will review with provider in office.

## 2015-04-12 NOTE — Telephone Encounter (Signed)
Discussed with office staff, reviewed event monitor recording, 1 week event monitor did not pick afib, however he has SOB and palpitation after turning in event monitor concerning for recurrent afib. He was seen in urgent care after his heart settled down, he was in NSR. He likely is going in and out of afib.   Will start low dose flecainide and plan for ETT as outpatient. Refer to afib clinic for further evaluation. Previous plan to is continue NOAC for 1 month after cardioversion then convert to ASA given his low CHA2DS2-Vasc score, however may need to continue longer period until we can make sure he is not having recurrence anymore. Need to stop drinking alcohol which is a major contributor to recurrence. Unable to increase rate control medication due to baseline borderline BP.   Daniel Corrigan PA Pager: [phone]

## 2015-04-12 NOTE — Telephone Encounter (Signed)
Reviewed with Almyra Deforest, PA.  Monitor strips show SR. Strips reviewed by Almyra Deforest, PA.  Instructions given for pt to start flecainide 50 mg by mouth twice daily. Will need ETT one week after starting flecainide.  Would like pt to be seen in atrial fib clinic tomorrow. Once ETT scheduled appt with Melina Copa, PA can be cancelled.  Follow up per atrial fib clinic. I spoke with pt and gave him this information. I told him office would be in contact with him regarding appt times.

## 2015-04-13 ENCOUNTER — Encounter (HOSPITAL_COMMUNITY): Payer: Self-pay | Admitting: Nurse Practitioner

## 2015-04-13 ENCOUNTER — Telehealth: Payer: Self-pay | Admitting: Interventional Cardiology

## 2015-04-13 ENCOUNTER — Ambulatory Visit (HOSPITAL_COMMUNITY)
Admission: RE | Admit: 2015-04-13 | Discharge: 2015-04-13 | Disposition: A | Discharge status: Home / Self Care | Payer: 59 | Source: Ambulatory Visit | Attending: Nurse Practitioner | Admitting: Nurse Practitioner

## 2015-04-13 VITALS — BP 98/78 | HR 69 | Ht 73.0 in | Wt 189.0 lb

## 2015-04-13 DIAGNOSIS — I480 Paroxysmal atrial fibrillation: Secondary | ICD-10-CM | POA: Diagnosis not present

## 2015-04-13 NOTE — Telephone Encounter (Signed)
Calling stating he saw Daniel Orozco this AM and was told to continue the Flecianide.  States he was standing at a console this afternoon and felt light headed; his HR went up to 198 and in less than a few seconds went to 68.  He wears an apple watch which records all vitals.  HR then dropped to 40; 65; up to 120 and now he is at home and HR is 75-80 and he feels fine.  States he has taken 2 doses of Flecianide-one last night and one this AM. HR seems to increase when he is up on feet.  Spoke w/Dr. Philemon Kingdom who advises that if HR goes back up to 198 and does not drop within a few seconds then he needs to go to ER.  Advised that since this is a holiday weekend there is always a physician on call but needs to go to ER with someone driving him if HR reaches that level again. He verbalizes understanding and will comply.

## 2015-04-13 NOTE — Progress Notes (Signed)
Patient ID: Daniel Orozco, male   DOB: 07/05/1970, 44 y.o.   MRN: [id number]     Primary Care Physician: Cathlean Cower, MD Referring Physician: Dr. Doneta Public is a 44 y.o. male with a h/o newly diagnosed afib admitted to [facility] 12/13 and underwent cardioversion and d/c home 2/14.Marland Kitchen He was started on BB and eliquis to take x 30 days with chadsvasc score of 0. Echo with normal EF.  He has had return of symptoms since d/c and was started on flecainide 50 mg bid and asked to f/u in afib clinic today. He has had two doses and feels well. Ekg shows  SR with normal intervals. He continues on xarelto and BB. He has cut way down on alcohol and caffeine. He does snore but wife states since he cut out alcohol he does not sound like he stops breathing. Will watch for now but may need a sleep study going forward.  Today, he denies symptoms of palpitations, chest pain, shortness of breath, orthopnea, PND, lower extremity edema, dizziness, presyncope, syncope, or neurologic sequela. The patient is tolerating medications without difficulties and is otherwise without complaint today.   Past Medical History  Diagnosis Date  . Arthritis   . S/P balloon dilatation of esophageal stricture   . Crohn disease ([place])     in remission since 2000   Past Surgical History  Procedure Laterality Date  . Fracture surgery    . Esophageal dilation    . Tee without cardioversion N/A 03/28/2015    Procedure: TRANSESOPHAGEAL ECHOCARDIOGRAM (TEE);  Surgeon: Dorothy Spark, MD;  Location: [place];  Service: Cardiovascular;  Laterality: N/A;  . Cardioversion N/A 03/28/2015    Procedure: CARDIOVERSION;  Surgeon: Dorothy Spark, MD;  Location: [facility] ENDOSCOPY;  Service: Cardiovascular;  Laterality: N/A;    Current Outpatient Prescriptions  Medication Sig Dispense Refill  . apixaban (ELIQUIS) 5 MG TABS tablet Take 1 tablet (5 mg total) by mouth 2 (two) times daily. 60 tablet 0  . flecainide (TAMBOCOR) 50 MG  tablet Take 1 tablet (50 mg total) by mouth 2 (two) times daily. 60 tablet 3  . metoprolol succinate (TOPROL-XL) 50 MG 24 hr tablet Take 1 tablet (50 mg total) by mouth daily. 90 tablet 3   No current facility-administered medications for this encounter.    Allergies  Allergen Reactions  . Codeine Other (See Comments)    REACTION: upsets stomach  . Other Swelling    Honey dew melon causes throat swelling    Social History   Social History  . Marital Status: Married    Spouse Name: N/A  . Number of Children: N/A  . Years of Education: N/A   Occupational History  . Not on file.   Social History Main Topics  . Smoking status: Former Research scientist (life sciences)  . Smokeless tobacco: Never Used     Comment: quit in 1999, only smoked for 2 years  . Alcohol Use: 0.0 oz/week    0 Standard drinks or equivalent per week     Comment: 2-3 beers daily  . Drug Use: No  . Sexual Activity: Yes    Birth Control/ Protection: Other-see comments   Other Topics Concern  . Not on file   Social History Narrative    Family History  Problem Relation Age of Onset  . Heart disease Maternal Grandfather   . Heart disease Father     RBBB    ROS- All systems are reviewed and negative except as  per the HPI above  Physical Exam: Filed Vitals:   04/13/15 1024  BP: 98/78  Pulse: 69  Height: 6\' 1"  (1.854 m)  Weight: 189 lb (85.73 kg)    GEN- The patient is well appearing, alert and oriented x 3 today.   Head- normocephalic, atraumatic Eyes-  Sclera clear, conjunctiva pink Ears- hearing intact Oropharynx- clear Neck- supple, no JVP Lymph- no cervical lymphadenopathy Lungs- Clear to ausculation bilaterally, normal work of breathing Heart- Regular rate and rhythm, no murmurs, rubs or gallops, PMI not laterally displaced GI- soft, NT, ND, + BS Extremities- no clubbing, cyanosis, or edema MS- no significant deformity or atrophy Skin- no rash or lesion Psych- euthymic mood, full affect Neuro- strength and  sensation are intact  EKG- NSR with LAD, pr int 160 ms, qrs int 88 ms, qtc 398 ms. Epic records reviewed.  Assessment and Plan: 1. PAF On flecainide 50 mg bid Pending GXT next week Continue metoprolol and xarelto x 30 days form cardioversion but most likely will not need long term Continue to avoid alcohol and minimize caffeine Will monitor snoring for now, but will consider if needs sleep study for later  F/u in afib clinic in 1-2 weeks  Daniel Orozco, [facility] [address] [place], [place] [address] [phone]

## 2015-04-13 NOTE — Telephone Encounter (Signed)
Pt started Flecianide yesterday per Isaac Laud and now having heart rate up to 198 pulse 60 after mild activity, at rest 114-115, having some light headedness and some SOB-pt just saw Roderic Palau at the [place] [facility] today at [place]- pls advise [phone]

## 2015-04-17 ENCOUNTER — Encounter: Payer: 59 | Admitting: Physician Assistant

## 2015-04-19 ENCOUNTER — Ambulatory Visit (HOSPITAL_COMMUNITY)
Admission: RE | Admit: 2015-04-19 | Discharge: 2015-04-19 | Disposition: A | Discharge status: Home / Self Care | Payer: 59 | Source: Ambulatory Visit | Attending: Physician Assistant | Admitting: Physician Assistant

## 2015-04-19 ENCOUNTER — Other Ambulatory Visit: Payer: Self-pay | Admitting: Physician Assistant

## 2015-04-19 DIAGNOSIS — Z79899 Other long term (current) drug therapy: Secondary | ICD-10-CM | POA: Insufficient documentation

## 2015-04-19 DIAGNOSIS — Z5181 Encounter for therapeutic drug level monitoring: Secondary | ICD-10-CM | POA: Insufficient documentation

## 2015-04-19 LAB — EXERCISE TOLERANCE TEST
CHL CUP MPHR: 175 {beats}/min
CHL CUP RESTING HR STRESS: 75 {beats}/min
CHL RATE OF PERCEIVED EXERTION: 13
CSEPHR: 87 %
Estimated workload: 13.4 METS
Exercise duration (min): 11 min
Exercise duration (sec): 1 s
Peak HR: 153 {beats}/min

## 2015-04-23 ENCOUNTER — Ambulatory Visit (HOSPITAL_COMMUNITY): Payer: 59 | Admitting: Nurse Practitioner

## 2015-04-23 ENCOUNTER — Encounter: Payer: Self-pay | Admitting: Physician Assistant

## 2015-04-23 ENCOUNTER — Telehealth: Payer: Self-pay | Admitting: Interventional Cardiology

## 2015-04-23 NOTE — Telephone Encounter (Signed)
Pt aware that Dr.Smith has reviewed his chart and recommend that pt f/u with the afib clinic as planned.adv pt that I will fwd a message to Stacey,RN to f/u with pt asap with a f/u appt since his appt today was cancelled due to the inclement weather. Adv pt to go to an urgent care to be treated for his gout flare up. Pt voice appreciation and verbalized understanding.

## 2015-04-23 NOTE — Telephone Encounter (Signed)
New message  Pt called states that. Anything that he can take for anti inflammatory. He has gout in his foot and is there anything that he can take for it other than tylenol.   Yesterday he hit 215 BMP. He is spiking. He is able to record it. He stayed at 215. He felt like he is going back into Afib. He is just curious because he just did a stress test and pt states that per [facility] the PA states that he can an extra flecainide. Request a call back to discuss this because his heart is racing.

## 2015-04-23 NOTE — Telephone Encounter (Signed)
Returned pt call. Pt sts that he thinks he has had a flare up of gout. Pt sts that he has been taking Tylenol with no relief. Adv pt that he should f/u with his pcp for treatment and that Tylenol is for pain but no indicated to treat gout. Pt sts that he does not have a pcp, adv pt to go to an urgent care.pt agreeable and sts that he will. Pt was to be seen today in the Afib Clinic today but his appt was cancelled because of inclement weather. Pt sts that they were going to call him back to reschedule. Pt feels that he is going in and out of Afib, he is usually able to tell. Pt reports that his heartrate today was between 90-100bpm. Pt st that 2 days ago his heartrate was 215bpm, adv pt that for a heart rate that high he should have gone to the ED.adv pt that I cannot find were Isaac Laud Meng,PA instructed pt to take an add flecainide for breakthrough afib. Adv pt that I will discuss with a provider in our office and call back with their recommendation. Pt voiced appreciation.

## 2015-04-24 NOTE — Telephone Encounter (Signed)
appt made

## 2015-04-25 ENCOUNTER — Ambulatory Visit (HOSPITAL_COMMUNITY)
Admission: RE | Admit: 2015-04-25 | Discharge: 2015-04-25 | Disposition: A | Discharge status: Home / Self Care | Payer: 59 | Source: Ambulatory Visit | Attending: Nurse Practitioner | Admitting: Nurse Practitioner

## 2015-04-25 ENCOUNTER — Encounter (HOSPITAL_COMMUNITY): Payer: Self-pay | Admitting: Nurse Practitioner

## 2015-04-25 VITALS — BP 116/82 | HR 74 | Ht 73.0 in | Wt 183.8 lb

## 2015-04-25 DIAGNOSIS — I480 Paroxysmal atrial fibrillation: Secondary | ICD-10-CM | POA: Diagnosis not present

## 2015-04-25 NOTE — Progress Notes (Signed)
Patient ID: Daniel Orozco, male   DOB: Feb 10, 1971, 45 y.o.   MRN: [id number]     Primary Care Physician: Cathlean Cower, MD Referring Physician: Dr. Doneta Public is a 45 y.o. male with a h/o newly diagnosed afib on flecainide 50 mg bid for f/u. He reports that he has had one episode of afib that lasted less than 10 minutes. Therefore will continue with 50 mg and go up on dose if has more afib breakthrough. He will continue apixaban for now but will not require long term. He recently had a negative GXT.  Today, he denies symptoms of palpitations, chest pain, shortness of breath, orthopnea, PND, lower extremity edema, dizziness, presyncope, syncope, or neurologic sequela. The patient is tolerating medications without difficulties and is otherwise without complaint today.   Past Medical History  Diagnosis Date  . Arthritis   . S/P balloon dilatation of esophageal stricture   . Crohn disease ([place])     in remission since 2000   Past Surgical History  Procedure Laterality Date  . Fracture surgery    . Esophageal dilation    . Tee without cardioversion N/A 03/28/2015    Procedure: TRANSESOPHAGEAL ECHOCARDIOGRAM (TEE);  Surgeon: Dorothy Spark, MD;  Location: [place];  Service: Cardiovascular;  Laterality: N/A;  . Cardioversion N/A 03/28/2015    Procedure: CARDIOVERSION;  Surgeon: Dorothy Spark, MD;  Location: [facility] ENDOSCOPY;  Service: Cardiovascular;  Laterality: N/A;    Current Outpatient Prescriptions  Medication Sig Dispense Refill  . apixaban (ELIQUIS) 5 MG TABS tablet Take 1 tablet (5 mg total) by mouth 2 (two) times daily. 60 tablet 0  . flecainide (TAMBOCOR) 50 MG tablet Take 1 tablet (50 mg total) by mouth 2 (two) times daily. 60 tablet 3  . methylPREDNISolone acetate (DEPO-MEDROL) 40 MG/ML injection (RADIOLOGY ONLY)     . metoprolol succinate (TOPROL-XL) 50 MG 24 hr tablet Take 1 tablet (50 mg total) by mouth daily. 90 tablet 3   No current  facility-administered medications for this encounter.    Allergies  Allergen Reactions  . Codeine Other (See Comments)    REACTION: upsets stomach  . Other Swelling    Honey dew melon causes throat swelling    Social History   Social History  . Marital Status: Married    Spouse Name: N/A  . Number of Children: N/A  . Years of Education: N/A   Occupational History  . Not on file.   Social History Main Topics  . Smoking status: Former Research scientist (life sciences)  . Smokeless tobacco: Never Used     Comment: quit in 1999, only smoked for 2 years  . Alcohol Use: 0.0 oz/week    0 Standard drinks or equivalent per week     Comment: 2-3 beers daily  . Drug Use: No  . Sexual Activity: Yes    Birth Control/ Protection: Other-see comments   Other Topics Concern  . Not on file   Social History Narrative    Family History  Problem Relation Age of Onset  . Heart disease Maternal Grandfather   . Heart disease Father     RBBB    ROS- All systems are reviewed and negative except as per the HPI above  Physical Exam: Filed Vitals:   04/25/15 1447  BP: 116/82  Pulse: 74  Height: 6\' 1"  (1.854 m)  Weight: 183 lb 12.8 oz (83.371 kg)    GEN- The patient is well appearing, alert and oriented x 3 today.  Head- normocephalic, atraumatic Eyes-  Sclera clear, conjunctiva pink Ears- hearing intact Oropharynx- clear Neck- supple, no JVP Lymph- no cervical lymphadenopathy Lungs- Clear to ausculation bilaterally, normal work of breathing Heart- Regular rate and rhythm, no murmurs, rubs or gallops, PMI not laterally displaced GI- soft, NT, ND, + BS Extremities- no clubbing, cyanosis, or edema MS- no significant deformity or atrophy Skin- no rash or lesion Psych- euthymic mood, full affect Neuro- strength and sensation are intact  EKG- NSR with v rate of 74 bpm, LAD, pr int 162 ms, qrs int 90 ms, qtc 415 ms  Assessment and Plan: 1. afib SR on flecainide/metoprolol with very little afib  burden Continue 50 mg bid Continue eliquis for now but will not require long term  F/u in 3 weeks  Butch Penny C. Dacoda Spallone, [facility] [address] [place], [place] [address] [phone]

## 2015-04-26 ENCOUNTER — Other Ambulatory Visit: Payer: Self-pay | Admitting: Physician Assistant

## 2015-05-01 ENCOUNTER — Other Ambulatory Visit: Payer: Self-pay | Admitting: *Deleted

## 2015-05-01 MED ORDER — APIXABAN 5 MG PO TABS: 5.0000 mg | ORAL_TABLET | Freq: Two times a day (BID) | ORAL | Status: DC

## 2015-05-15 ENCOUNTER — Encounter (HOSPITAL_COMMUNITY): Payer: Self-pay | Admitting: Nurse Practitioner

## 2015-05-15 ENCOUNTER — Ambulatory Visit: Payer: Worker's Compensation

## 2015-05-15 ENCOUNTER — Ambulatory Visit (INDEPENDENT_AMBULATORY_CARE_PROVIDER_SITE_OTHER): Payer: Worker's Compensation | Admitting: Family Medicine

## 2015-05-15 ENCOUNTER — Ambulatory Visit (HOSPITAL_COMMUNITY)
Admission: RE | Admit: 2015-05-15 | Discharge: 2015-05-15 | Disposition: A | Discharge status: Home / Self Care | Payer: 59 | Source: Ambulatory Visit | Attending: Nurse Practitioner | Admitting: Nurse Practitioner

## 2015-05-15 VITALS — BP 116/80 | HR 77 | Ht 73.0 in | Wt 181.2 lb

## 2015-05-15 VITALS — BP 110/78 | HR 81 | Temp 98.6°F | Resp 18 | Ht 72.0 in | Wt 180.0 lb

## 2015-05-15 DIAGNOSIS — I480 Paroxysmal atrial fibrillation: Secondary | ICD-10-CM | POA: Insufficient documentation

## 2015-05-15 DIAGNOSIS — S60211A Contusion of right wrist, initial encounter: Secondary | ICD-10-CM

## 2015-05-15 NOTE — Patient Instructions (Addendum)
Because you received an x-ray today, you will receive an invoice from [facility]. Please contact [facility] at [phone] with questions or concerns regarding your invoice. Our billing staff will not be able to assist you with those questions.   It does not appear that you have any fracture in your wrist.  However, as it is sore use the splint to protect the wrist.  Let us know if your pain does not go away in the next week or so- in that case please either call us or come back in. I will not give you any other restrictions as you are already on restrictions for your heart issue

## 2015-05-15 NOTE — Progress Notes (Signed)
Patient ID: Daniel Orozco, male   DOB: 03-May-1970, 45 y.o.   MRN: [id number]     Primary Care Physician: Cathlean Cower, MD Referring Physician: Dr. Doneta Public is a 45 y.o. male with a h/o esophageal stricture, prior alcohol use, newly diagnosed afib 12/16, with successful cardioversion in the ER, on flecainide 50 mg bid for f/u. He reports that he has been having less afib burden on daily flecainide but still has some breakthrough afib of short duration. He feels ok with flecainide but would not like to take long term. 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 We discussed options and he is interested in pursing ablation. Will continue apixaban for now, chadsvasc score of 0( taking for 30 days past cardioversion) but will not require long term. He recently had a negative GXT on flecainde. Left atrium is normal in size.  Today, he denies symptoms of palpitations, chest pain, shortness of breath, orthopnea, PND, lower extremity edema, dizziness, presyncope, syncope, or neurologic sequela. The patient is tolerating medications without difficulties and is otherwise without complaint today.   Past Medical History  Diagnosis Date  . Arthritis   . S/P balloon dilatation of esophageal stricture   . Crohn disease ([place])     in remission since 2000   Past Surgical History  Procedure Laterality Date  . Fracture surgery    . Esophageal dilation    . Tee without cardioversion N/A 03/28/2015    Procedure: TRANSESOPHAGEAL ECHOCARDIOGRAM (TEE);  Surgeon: Dorothy Spark, MD;  Location: [place];  Service: Cardiovascular;  Laterality: N/A;  . Cardioversion N/A 03/28/2015    Procedure: CARDIOVERSION;  Surgeon: Dorothy Spark, MD;  Location: [facility] ENDOSCOPY;  Service: Cardiovascular;  Laterality: N/A;    Current Outpatient Prescriptions  Medication Sig Dispense Refill  . apixaban (ELIQUIS) 5 MG TABS tablet Take 1 tablet (5 mg total)  by mouth 2 (two) times daily. 60 tablet 1  . flecainide (TAMBOCOR) 50 MG tablet Take 1 tablet (50 mg total) by mouth 2 (two) times daily. 60 tablet 3  . methylPREDNISolone acetate (DEPO-MEDROL) 40 MG/ML injection (RADIOLOGY ONLY) Reported on 05/15/2015    . metoprolol succinate (TOPROL-XL) 50 MG 24 hr tablet Take 1 tablet (50 mg total) by mouth daily. 90 tablet 3   No current facility-administered medications for this encounter.    Allergies  Allergen Reactions  . Codeine Other (See Comments)    REACTION: upsets stomach  . Other Swelling    Honey dew melon causes throat swelling    Social History   Social History  . Marital Status: Married    Spouse Name: N/A  . Number of Children: N/A  . Years of Education: N/A   Occupational History  . Not on file.   Social History Main Topics  . Smoking status: Former Research scientist (life sciences)  . Smokeless tobacco: Never Used     Comment: quit in 1999, only smoked for 2 years  . Alcohol Use: 0.0 oz/week    0 Standard drinks or equivalent per week     Comment: 2-3 beers daily  . Drug Use: No  . Sexual Activity: Yes    Birth Control/ Protection: Other-see comments   Other Topics Concern  . Not on file   Social History Narrative    Family History  Problem Relation Age of Onset  . Heart disease Maternal Grandfather   . Heart disease Father  RBBB    ROS- All systems are reviewed and negative except as per the HPI above  Physical Exam: Filed Vitals:   05/15/15 1444  BP: 116/80  Pulse: 77  Height: 6\' 1"  (1.854 m)  Weight: 181 lb 3.2 oz (82.192 kg)    GEN- The patient is well appearing, alert and oriented x 3 today.   Head- normocephalic, atraumatic Eyes-  Sclera clear, conjunctiva pink Ears- hearing intact Oropharynx- clear Neck- supple, no JVP Lymph- no cervical lymphadenopathy Lungs- Clear to ausculation bilaterally, normal work of breathing Heart- Regular rate and rhythm, no murmurs, rubs or gallops, PMI not laterally  displaced GI- soft, NT, ND, + BS Extremities- no clubbing, cyanosis, or edema MS- no significant deformity or atrophy Skin- no rash or lesion Psych- euthymic mood, full affect Neuro- strength and sensation are intact  EKG- NSR with v rate of 77 bpm, LAD, pr int 158 ms, qrs int 92 ms, qtc 432 ms Epic records reviewed ETT-12/14-- Left ventricle: The cavity size was normal. Wall thickness was normal. Systolic function was normal. The estimated ejection fraction was in the range of 55% to 60%. Wall motion was normal; there were no regional wall motion abnormalities. - Aortic valve: No evidence of vegetation. - Mitral valve: No evidence of vegetation. - Left atrium: No evidence of thrombus in the atrial cavity or appendage. Normal in size. - Right atrium: No evidence of thrombus in the atrial cavity or appendage. No evidence of thrombus in the appendage. - Atrial septum: No defect or patent foramen ovale was identified. Echo contrast study showed no right-to-left atrial level shunt, following an increase in RA pressure induced by provocative maneuvers. - Pulmonic valve: No evidence of vegetation.  Impressions:  - No cardiac source of emboli was indentified.The study was followed by a successful cardioversion.  Assessment and Plan: 1. afib SR on flecainide/metoprolol, improved, with some continuing  afib burden Does not want  to take flecainide long term and is interested in ablation Will refer to Dr. Rayann Heman for that discussion, but pt think would be a good candidate Continue eliquis for now, due to interest in ablation, but probably will not require long term, chadsvasc score of 0   afib clinic as needed  Butch Penny C. Lisa-Marie Rueger, [facility] [address] [place], [place] [address] [phone]

## 2015-05-15 NOTE — Progress Notes (Signed)
Daniel Orozco May 11, 1970 45 y.o.   Chief Complaint  Patient presents with  . Wrist Injury    left side, fell this morning W/C     Date of Injury: 05/15/15  History of Present Illness:  Presents for evaluation of work-related complaint.  He was working at [employer] this am- some brake fluid spilled and he slipped, he had a foosh on the left wrist.  He notes that the wrist still hurts and he wanted to get an x-ray to ensure no fracture  He did bump his head on a plastic car bumper but no significant injury to his head.    He is OW generally healthy except he does have a fib- he is on eliquis, metoporlol and flecainide and is seeing cardiology to today to discuss this further ROS    Allergies  Allergen Reactions  . Codeine Other (See Comments)    REACTION: upsets stomach  . Other Swelling    Honey dew melon causes throat swelling     Current medications reviewed and updated. Past medical history, family history, social history have been reviewed and updated.   Physical Exam  Filed Vitals:   05/15/15 1144  BP: 110/78  Pulse: 81  Temp: 98.6 F (37 C)  Resp: 18    GEN: WDWN, NAD, Non-toxic, A & O x 3, looks well HEENT: Atraumatic, Normocephalic. Neck supple. No masses, No LAD. Ears and Nose: No external deformity. CV: RRR, No M/G/R. No JVD. No thrill. No extra heart sounds. PULM: CTA B, no wheezes, crackles, rhonchi. No retractions. No resp. distress. No accessory muscle use.Marland Kitchen EXTR: No c/c/e NEURO Normal gait.  PSYCH: Normally interactive. Conversant. Not depressed or anxious appearing.  Calm demeanor.  Left wrist: tenderness over the carpal bones- not over the radius or ulna.  No snuffbox tenderness  No bruise or swelling. Composite fist.  Normal strength, sensation and perfusion of hand   .Dg Wrist Complete Left  05/15/2015  CLINICAL DATA:  Status post fall this morning with left wrist injury. EXAM: LEFT WRIST - COMPLETE 3+ VIEW COMPARISON:  None in  PACs FINDINGS: The bones of the wrist are adequately mineralized. The distal radius and ulna are intact. The carpal bones appear intact. The joint spaces are preserved. The metacarpal bases are normal where visualized. There is mild soft tissue swelling over the dorsum of the wrist. IMPRESSION: There is no acute fracture nor dislocation of the bones of the left wrist. Electronically Signed   By: David  Martinique M.D.   On: 05/15/2015 13:05    Assessment and Plan:  Wrist contusion, right, initial encounter - Plan: DG Wrist Complete Left  Wrist contusion that occurred at work today when he slipped and fell No apparent fracture Wrist splint for protection and support Cautioned regarding any severe HA or vomiting- in that case go to ER as he is on eliquis

## 2015-05-16 ENCOUNTER — Ambulatory Visit (INDEPENDENT_AMBULATORY_CARE_PROVIDER_SITE_OTHER): Payer: 59 | Admitting: Internal Medicine

## 2015-05-16 ENCOUNTER — Encounter: Payer: Self-pay | Admitting: Internal Medicine

## 2015-05-16 VITALS — BP 104/72 | HR 76 | Ht 72.0 in | Wt 183.0 lb

## 2015-05-16 DIAGNOSIS — I4891 Unspecified atrial fibrillation: Secondary | ICD-10-CM

## 2015-05-16 NOTE — Patient Instructions (Signed)
Medication Instructions:  Your physician recommends that you continue on your current medications as directed. Please refer to the Current Medication list given to you today.  Labwork: None ordered  Testing/Procedures: Your physician has recommended that you have an ablation. Catheter ablation is a medical procedure used to treat some cardiac arrhythmias (irregular heartbeats). During catheter ablation, a long, thin, flexible tube is put into a blood vessel in your groin (upper thigh), or neck. This tube is called an ablation catheter. It is then guided to your heart through the blood vessel. Radio frequency waves destroy small areas of heart tissue where abnormal heartbeats may cause an arrhythmia to start.   Please call Janan Halter, RN when you are ready to schedule this procedure.  [id number]   Follow-Up: To be determined once procedure is scheduled.  If you need a refill on your cardiac medications before your next appointment, please call your pharmacy.  Thank you for choosing [facility]!!   Janan Halter, RN [phone]    Cardiac Ablation Cardiac ablation is a procedure to disable a small amount of heart tissue in very specific places. The heart has many electrical connections. Sometimes these connections are abnormal and can cause the heart to beat very fast or irregularly. By disabling some of the problem areas, heart rhythm can be improved or made normal. Ablation is done for people who:   Have Wolff-Parkinson-White syndrome.   Have other fast heart rhythms (tachycardia).   Have taken medicines for an abnormal heart rhythm (arrhythmia) that resulted in:   No success.   Side effects.   May have a high-risk heartbeat that could result in death.  LET [facility] CARE PROVIDER KNOW ABOUT:   Any allergies you have or any previous reactions you have had to X-ray dye, food (such as seafood), medicine, or tape.   All medicines you are taking, including  vitamins, herbs, eye drops, creams, and over-the-counter medicines.   Previous problems you or members of your family have had with the use of anesthetics.   Any blood disorders you have.   Previous surgeries or procedures (such as a kidney transplant) you have had.   Medical conditions you have (such as kidney failure).  RISKS AND COMPLICATIONS Generally, cardiac ablation is a safe procedure. However, problems can occur and include:   Increased risk of cancer. Depending on how long it takes to do the ablation, the dose of radiation can be high.  Bruising and bleeding where a thin, flexible tube (catheter) was inserted during the procedure.   Bleeding into the chest, especially into the sac that surrounds the heart (serious).  Need for a permanent pacemaker if the normal electrical system is damaged.   The procedure may not be fully effective, and this may not be recognized for months. Repeat ablation procedures are sometimes required. BEFORE THE PROCEDURE   Follow any instructions from your health care provider regarding eating and drinking before the procedure.   Take your medicines as directed at regular times with water, unless instructed otherwise by your health care provider. If you are taking diabetes medicine, including insulin, ask how you are to take it and if there are any special instructions you should follow. It is common to adjust insulin dosing the day of the ablation.  PROCEDURE  An ablation is usually performed in a catheterization laboratory with the guidance of fluoroscopy. Fluoroscopy is a type of X-ray that helps your health care provider see images of your heart during the procedure.  An ablation is a minimally invasive procedure. This means a small cut (incision) is made in either your neck or groin. Your health care provider will decide where to make the incision based on your medical history and physical exam.  An IV tube will be started before  the procedure begins. You will be given an anesthetic or medicine to help you relax (sedative).  The skin on your neck or groin will be numbed. A needle will be inserted into a large vein in your neck or groin and catheters will be threaded to your heart.  A special dye that shows up on fluoroscopy pictures may be injected through the catheter. The dye helps your health care provider see the area of the heart that needs treatment.  The catheter has electrodes on the tip. When the area of heart tissue that is causing the arrhythmia is found, the catheter tip will send an electrical current to the area and "scar" the tissue. Three types of energy can be used to ablate the heart tissue:   Heat (radiofrequency energy).   Laser energy.   Extreme cold (cryoablation).   When the area of the heart has been ablated, the catheter will be taken out. Pressure will be held on the insertion site. This will help the insertion site clot and keep it from bleeding. A bandage will be placed on the insertion site.  AFTER THE PROCEDURE   After the procedure, you will be taken to a recovery area where your vital signs (blood pressure, heart rate, and breathing) will be monitored. The insertion site will also be monitored for bleeding.   You will need to lie still for 4-6 hours. This is to ensure you do not bleed from the catheter insertion site.    This information is not intended to replace advice given to you by your health care provider. Make sure you discuss any questions you have with your health care provider.   Document Released: 08/17/2008 Document Revised: 04/21/2014 Document Reviewed: 08/23/2012 Elsevier Interactive Patient Education [employer].

## 2015-05-17 ENCOUNTER — Telehealth: Payer: Self-pay | Admitting: Internal Medicine

## 2015-05-17 DIAGNOSIS — I480 Paroxysmal atrial fibrillation: Secondary | ICD-10-CM

## 2015-05-17 NOTE — Telephone Encounter (Signed)
New Message   Pt requested to speak w/ RN concerning scheduling an ablation- stated that he as told to sched today as it would push him in to April if he waited. Please call back and discuss.

## 2015-05-17 NOTE — Telephone Encounter (Signed)
Pt called to scheduled the Ablation today, because pt  wants to have it scheduled earlier, as he was recommended to call to schedule the procedure earlier so he does not have to wait until April to have it done. Pt is aware that Janan Halter Dr. Jackalyn Lombard nurse is not in today. She will be here tomorrow Friday. RN probable will call you then to scheduled the ablation then.

## 2015-05-18 ENCOUNTER — Encounter: Payer: Self-pay | Admitting: Internal Medicine

## 2015-05-18 NOTE — Telephone Encounter (Signed)
Spoke with patient and scheduled him for 07/03/15 with labs 06/18/15.  He will need a cardiac CT 1-2 weeks prior to the ablation.  He is aware of labs and knows he will get a call to schedule the CT

## 2015-05-18 NOTE — Telephone Encounter (Signed)
Follow Up  Pt called. Request a call back to schedule ablation

## 2015-05-18 NOTE — Progress Notes (Addendum)
Electrophysiology Office Note   Date:  05/18/2015   ID:  Daniel Orozco, Daniel Orozco 17-Sep-1970, MRN [id number]  PCP:  Cathlean Cower, MD  Cardiologist:  Dr Tamala Julian Primary Electrophysiologist: [name], MD    Chief Complaint  Patient presents with  . Atrial Fibrillation     History of Present Illness: Daniel Orozco is a 45 y.o. male who presents today for electrophysiology evaluation.   He reports initially being diagnosed with atrial fibrillation 12/16 after presenting with tachypalpitations and fatigue.  In restrospect, he feels that he has had afib longer than this.  He was evaluated and underwent cardioversion in the ED.  He has been treated with flecainide but continues to have episodic afib.  He reports fatigue with metoprolol and flecainide.  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  Left atrium is normal in size.   Today, he denies symptoms of palpitations, chest pain, shortness of breath, orthopnea, PND, lower extremity edema, claudication, dizziness, presyncope, syncope, bleeding, or neurologic sequela. The patient is tolerating medications without difficulties and is otherwise without complaint today.    Past Medical History  Diagnosis Date  . Arthritis   . S/P balloon dilatation of esophageal stricture   . Crohn disease ([place])     in remission since 2000   Past Surgical History  Procedure Laterality Date  . Fracture surgery    . Esophageal dilation    . Tee without cardioversion N/A 03/28/2015    Procedure: TRANSESOPHAGEAL ECHOCARDIOGRAM (TEE);  Surgeon: Dorothy Spark, MD;  Location: [place];  Service: Cardiovascular;  Laterality: N/A;  . Cardioversion N/A 03/28/2015    Procedure: CARDIOVERSION;  Surgeon: Dorothy Spark, MD;  Location: [facility] ENDOSCOPY;  Service: Cardiovascular;  Laterality: N/A;     Current Outpatient Prescriptions  Medication Sig Dispense Refill  . apixaban (ELIQUIS) 5 MG TABS tablet Take 1 tablet (5 mg total) by mouth 2  (two) times daily. 60 tablet 1  . flecainide (TAMBOCOR) 50 MG tablet Take 1 tablet (50 mg total) by mouth 2 (two) times daily. 60 tablet 3  . metoprolol succinate (TOPROL-XL) 50 MG 24 hr tablet Take 1 tablet (50 mg total) by mouth daily. 90 tablet 3   No current facility-administered medications for this visit.    Allergies:   Codeine and Other   Social History:  The patient  reports that he has quit smoking. He has never used smokeless tobacco. He reports that he drinks alcohol. He reports that he does not use illicit drugs.   Family History:  The patient's  family history includes Heart disease in his father and maternal grandfather.    ROS:  Please see the history of present illness.   All other systems are reviewed and negative.    PHYSICAL EXAM: VS:  BP 104/72 mmHg  Pulse 76  Ht 6' (1.829 m)  Wt 183 lb (83.008 kg)  BMI 24.81 kg/m2 , BMI Body mass index is 24.81 kg/(m^2). GEN: Well nourished, well developed, in no acute distress HEENT: normal Neck: no JVD, carotid bruits, or masses Cardiac: RRR; no murmurs, rubs, or gallops,no edema  Respiratory:  clear to auscultation bilaterally, normal work of breathing GI: soft, nontender, nondistended, + BS MS: no deformity or atrophy Skin: warm and dry  Neuro:  Strength and sensation are intact Psych: euthymic mood, full affect  EKG:  EKG 05/15/15 reveals sinus rhythm with LAHB  Recent Labs: 03/27/2015: ALT 28; TSH 1.048 03/28/2015: BUN 9; Creatinine, Ser  0.89; Hemoglobin 14.7; Platelets 255; Potassium 4.0; Sodium 140    Lipid Panel     Component Value Date/Time   CHOL 144 03/28/2015 0229   TRIG 110 03/28/2015 0229   HDL 29* 03/28/2015 0229   CHOLHDL 5.0 03/28/2015 0229   VLDL 22 03/28/2015 0229   LDLCALC 93 03/28/2015 0229     Wt Readings from Last 3 Encounters:  05/16/15 183 lb (83.008 kg)  05/15/15 181 lb 3.2 oz (82.192 kg)  05/15/15 180 lb (81.647 kg)      Other studies Reviewed: Additional studies/ records  that were reviewed today include: AF clinic notes, echo (TEE)     ASSESSMENT AND PLAN:  1.  Paroxysmal atrial fibrillation The patient continues to have episodic afib despite medical therapy with flecainide and metoprolol. Chads2vasc score is 0. Therapeutic strategies for afib including medicine and ablation were discussed in detail with the patient today. Risk, benefits, and alternatives to EP study and radiofrequency ablation for afib were also discussed in detail today. These risks include but are not limited to stroke, bleeding, vascular damage, tamponade, perforation, damage to the esophagus, lungs, and other structures, pulmonary vein stenosis, worsening renal function, and death. The patient understands these risk and wishes to contemplate this option.  He will contact my office if he decides to proceed with ablation.  Continue eliquis Would plan cardiac CT within 1 week of his ablation  Current medicines are reviewed at length with the patient today.   The patient does not have concerns regarding his medicines.  The following changes were made today:  none   Signed, [name], MD  05/18/2015 1:34 PM     [address] [place] [address] [phone] (office) [phone] (fax)

## 2015-05-21 ENCOUNTER — Telehealth: Payer: Self-pay | Admitting: Internal Medicine

## 2015-05-21 NOTE — Telephone Encounter (Signed)
Spoke with patient again and let him know he will receive an instruction sheet on 3/6 when he comes for his lab work.  Let him know he will receive a call from Ivin Booty to schedule his CT

## 2015-05-21 NOTE — Telephone Encounter (Signed)
New message     Patient having upcoming ablation  - patient is asking for date  Time and location of procedure

## 2015-05-22 ENCOUNTER — Ambulatory Visit (INDEPENDENT_AMBULATORY_CARE_PROVIDER_SITE_OTHER): Payer: 59 | Admitting: Family Medicine

## 2015-05-22 VITALS — BP 122/74 | HR 86 | Temp 98.7°F | Resp 16 | Wt 179.0 lb

## 2015-05-22 DIAGNOSIS — M10071 Idiopathic gout, right ankle and foot: Secondary | ICD-10-CM

## 2015-05-22 DIAGNOSIS — M109 Gout, unspecified: Secondary | ICD-10-CM

## 2015-05-22 MED ORDER — PREDNISONE 20 MG PO TABS: 40.0000 mg | ORAL_TABLET | Freq: Every day | ORAL | Status: DC

## 2015-05-22 MED ORDER — COLCHICINE 0.6 MG PO TABS: 0.6000 mg | ORAL_TABLET | Freq: Every day | ORAL | Status: DC

## 2015-05-22 MED ORDER — METHYLPREDNISOLONE SODIUM SUCC 125 MG IJ SOLR
62.5000 mg | Freq: Once | INTRAMUSCULAR | Status: AC
  Administered 2015-05-22: 62.5 mg via INTRAMUSCULAR

## 2015-05-22 MED ORDER — HYDROCODONE-ACETAMINOPHEN 5-325 MG PO TABS: 1.0000 | ORAL_TABLET | Freq: Four times a day (QID) | ORAL | Status: DC | PRN

## 2015-05-22 NOTE — Progress Notes (Signed)
Subjective:  By signing my name below, I, Rawaa Al Rifaie, attest that this documentation has been prepared under the direction and in the presence of Delman Cheadle, MD.  Leandra Kern, Medical Scribe. 05/22/2015.  10:22 AM.   Patient ID: Daniel Orozco, male    DOB: [birth year], 45 y.o.   MRN: [id number]  Chief Complaint  Patient presents with  . Foot Pain    right foot; yesterday; possible gout; more on the side of foot    HPI HPI Comments: Daniel Orozco is a 45 y.o. male who presents to [facility] complaining of right foot pain, onset yesterday.  Pt was diagnosed with A-fib with RVR last december, on anti-arrhythmic and anti-coagulation, well controlled. About 3 years ago, pt did have an episode of gout, uric acid was 9.2, presented in left foot, positive family history. Pt states that he completely changed his diet after being diagnosed, and notes that he does not eat red meat anymore. Pt's uric acid levels was checked 2 weeks ago that showed a 7.1 level.  Pt reports that the pain is very severe that he was not able to sleep last night. He also indicates that his left great toe looks deformed that it looks as if it was broken, however no associated pain to that area. Pt states that this is his 3rd of 4th gout flare-up in the past 3 weeks. He indicates that he is to follow up with specialists during the next few weeks. During the past month, pt took prednisone course for 7 days that gave him relief initially, however the symptoms came back after finishing the medication course. He was put on the prednisone again, and was given 2 injections. Pt states that he was previously prescribed hydrocodone, and indicates finding good pain relief with it. Pt has not taken any OTC medications for this, and he was never been on the Colcrys. Pt has a history of Chron's disease.    Patient Active Problem List   Diagnosis Date Noted  . Atrial fibrillation with RVR ([place]) 03/27/2015  .  GERD 08/07/2009  . CROHN'S [facility] INTESTINE 08/07/2009  . WEIGHT LOSS-ABNORMAL 08/07/2009  . NAUSEA WITH VOMITING 08/07/2009  . NAUSEA ALONE 08/07/2009  . ABDOMINAL PAIN-PERIUMBILICAL [id number]  . ABDOMINAL PAIN-EPIGASTRIC 08/07/2009  . ABDOMINAL PAIN, UPPER 08/07/2009  . ADHD 08/19/2007  . HAY FEVER 08/19/2007  . ALLERGIC RHINITIS 08/19/2007  . INGUINAL HERNIA, RIGHT 08/19/2007  . ASTHMA 06/22/2007  . CROHN'S DISEASE 06/22/2007  . DYSPHAGIA UNSPECIFIED 06/22/2007  . BENIGN NEOPLASM [place] SITE DIGESTIVE SYSTEM 03/01/2007  . ESOPHAGEAL STRICTURE 01/25/2007  . PAIN IN JOINT, ANKLE/FOOT 08/07/2006  . HALLUX RIGIDUS, ACQUIRED 08/07/2006  . HIATAL HERNIA 11/15/2005  . FOREIGN BODY IN ESOPHAGUS 11/15/2005   Past Medical History  Diagnosis Date  . Arthritis   . S/P balloon dilatation of esophageal stricture   . Crohn disease ([place])     in remission since 2000  . Paroxysmal atrial fibrillation [facility])    Past Surgical History  Procedure Laterality Date  . Fracture surgery    . Esophageal dilation    . Tee without cardioversion N/A 03/28/2015    Procedure: TRANSESOPHAGEAL ECHOCARDIOGRAM (TEE);  Surgeon: Dorothy Spark, MD;  Location: [facility];  Service: Cardiovascular;  Laterality: N/A;  . Cardioversion N/A 03/28/2015    Procedure: CARDIOVERSION;  Surgeon: Dorothy Spark, MD;  Location[facility] ENDOSCOPY;  Service: Cardiovascular;  Laterality: N/A;   Allergies  Allergen Reactions  .  Codeine Other (See Comments)    REACTION: upsets stomach  . Other Swelling    Honey dew melon causes throat swelling   Prior to Admission medications   Medication Sig Start Date End Date Taking? Authorizing Provider  apixaban (ELIQUIS) 5 MG TABS tablet Take 1 tablet (5 mg total) by mouth 2 (two) times daily. 05/01/15  Yes Belva Crome, MD  flecainide (TAMBOCOR) 50 MG tablet Take 1 tablet (50 mg total) by mouth 2 (two) times daily. 04/12/15  Yes Almyra Deforest, PA  metoprolol succinate  (TOPROL-XL) 50 MG 24 hr tablet Take 1 tablet (50 mg total) by mouth daily. 04/03/15  Yes Almyra Deforest, PA   Social History   Social History  . Marital Status: Married    Spouse Name: N/A  . Number of Children: N/A  . Years of Education: N/A   Occupational History  . Not on file.   Social History Main Topics  . Smoking status: Former Research scientist (life sciences)  . Smokeless tobacco: Never Used     Comment: quit in 1999, only smoked for 2 years  . Alcohol Use: 0.0 oz/week    0 Standard drinks or equivalent per week     Comment: 2-3 beers daily  . Drug Use: No  . Sexual Activity: Yes    Birth Control/ Protection: Other-see comments   Other Topics Concern  . 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 Negative for fever, chills, appetite change and unexpected weight change.  Cardiovascular: Positive for leg swelling.  Musculoskeletal: Positive for myalgias, joint swelling, arthralgias and gait problem.  Skin: Positive for color change. Negative for rash and wound.  Allergic/Immunologic: Negative for food allergies and immunocompromised state.  Neurological: Negative for weakness and numbness.  Hematological: Does not bruise/bleed easily.  Psychiatric/Behavioral: Negative for dysphoric mood.      Objective:   Physical Exam  Constitutional: He is oriented to person, place, and time. He appears well-developed and well-nourished. No distress.  HENT:  Head: Normocephalic and atraumatic.  Eyes: EOM are normal. Pupils are equal, round, and reactive to light.  Neck: Neck supple.  Cardiovascular: Normal rate.   Pulmonary/Chest: Effort normal.  Musculoskeletal: He exhibits edema and tenderness.  Swelling over the lateral aspect around the 5th  metatarsal, with mild erythema, edema, and warmth.  Neurological: He is alert and oriented to person, place, and time. No cranial nerve deficit.  Skin: Skin is warm and dry.  Psychiatric: He has a normal mood and affect. His behavior is normal.  Nursing note and vitals reviewed.   BP 122/74 mmHg  Pulse 86  Temp(Src) 98.7 F (37.1 C) (Oral)  Resp 16  Wt 179 lb (81.194 kg)  SpO2 95%     Assessment & Plan:   1. Acute gout of right foot, unspecified cause   Has been having recurrent gout flairs - almost back to back - despite appropriate diet changes.  Uric acid was not sig elev prior so will try daily colcrys for prevention rather than allopurinol.  Meds ordered this encounter  Medications  . methylPREDNISolone sodium succinate (SOLU-MEDROL) 125 mg/2 mL injection 62.5 mg    Sig:   . predniSONE (DELTASONE) 20 MG tablet  Sig: Take 2 tablets (40 mg total) by mouth daily with breakfast.    Dispense:  10 tablet    Refill:  0  . colchicine 0.6 MG tablet    Sig: Take 1 tablet (0.6 mg total) by mouth daily. At first sign of gout flair, take 2 tabs pox1 and 1 tab 1 hr later. Do not use >3 tabs in 24 hr    Dispense:  60 tablet    Refill:  0  . HYDROcodone-acetaminophen (NORCO/VICODIN) 5-325 MG tablet    Sig: Take 1 tablet by mouth every 6 (six) hours as needed for moderate pain.    Dispense:  30 tablet    Refill:  0    I personally performed the services described in this documentation, which was scribed in my presence. The recorded information has been reviewed and considered, and addended by me as needed.  Delman Cheadle, MD MPH

## 2015-05-22 NOTE — Patient Instructions (Signed)

## 2015-05-24 ENCOUNTER — Encounter: Payer: Self-pay | Admitting: Cardiology

## 2015-06-18 ENCOUNTER — Other Ambulatory Visit (INDEPENDENT_AMBULATORY_CARE_PROVIDER_SITE_OTHER): Payer: 59 | Admitting: *Deleted

## 2015-06-18 DIAGNOSIS — I480 Paroxysmal atrial fibrillation: Secondary | ICD-10-CM

## 2015-06-18 LAB — CBC WITH DIFFERENTIAL/PLATELET
BASOS PCT: 1 % (ref 0–1)
Basophils Absolute: 0 10*3/uL (ref 0.0–0.1)
EOS PCT: 7 % — AB (ref 0–5)
Eosinophils Absolute: 0.3 10*3/uL (ref 0.0–0.7)
HCT: 41 % (ref 39.0–52.0)
Hemoglobin: 15.1 g/dL (ref 13.0–17.0)
Lymphocytes Relative: 32 % (ref 12–46)
Lymphs Abs: 1.4 10*3/uL (ref 0.7–4.0)
MCH: 31.4 pg (ref 26.0–34.0)
MCHC: 36.8 g/dL — AB (ref 30.0–36.0)
MCV: 85.2 fL (ref 78.0–100.0)
MONO ABS: 0.5 10*3/uL (ref 0.1–1.0)
MPV: 10.1 fL (ref 8.6–12.4)
Monocytes Relative: 12 % (ref 3–12)
NEUTROS ABS: 2.1 10*3/uL (ref 1.7–7.7)
Neutrophils Relative %: 48 % (ref 43–77)
Platelets: 223 10*3/uL (ref 150–400)
RBC: 4.81 MIL/uL (ref 4.22–5.81)
RDW: 12.3 % (ref 11.5–15.5)
WBC: 4.3 10*3/uL (ref 4.0–10.5)

## 2015-06-18 LAB — BASIC METABOLIC PANEL
BUN: 12 mg/dL (ref 7–25)
CO2: 28 mmol/L (ref 20–31)
Calcium: 9.4 mg/dL (ref 8.6–10.3)
Chloride: 105 mmol/L (ref 98–110)
Creat: 0.68 mg/dL (ref 0.60–1.35)
GLUCOSE: 90 mg/dL (ref 65–99)
POTASSIUM: 4.2 mmol/L (ref 3.5–5.3)
SODIUM: 142 mmol/L (ref 135–146)

## 2015-06-26 ENCOUNTER — Encounter: Payer: Self-pay | Admitting: Family

## 2015-06-26 ENCOUNTER — Encounter (HOSPITAL_COMMUNITY): Payer: Self-pay

## 2015-06-26 ENCOUNTER — Ambulatory Visit (INDEPENDENT_AMBULATORY_CARE_PROVIDER_SITE_OTHER): Payer: 59 | Admitting: Family

## 2015-06-26 ENCOUNTER — Ambulatory Visit (HOSPITAL_COMMUNITY)
Admission: RE | Admit: 2015-06-26 | Discharge: 2015-06-26 | Disposition: A | Discharge status: Home / Self Care | Payer: 59 | Source: Ambulatory Visit | Attending: Internal Medicine | Admitting: Internal Medicine

## 2015-06-26 VITALS — BP 112/72 | HR 80 | Temp 98.1°F | Resp 14 | Ht 72.0 in | Wt 184.0 lb

## 2015-06-26 DIAGNOSIS — I480 Paroxysmal atrial fibrillation: Secondary | ICD-10-CM | POA: Insufficient documentation

## 2015-06-26 DIAGNOSIS — M109 Gout, unspecified: Secondary | ICD-10-CM | POA: Insufficient documentation

## 2015-06-26 DIAGNOSIS — M10079 Idiopathic gout, unspecified ankle and foot: Secondary | ICD-10-CM | POA: Diagnosis not present

## 2015-06-26 MED ORDER — METOPROLOL TARTRATE 1 MG/ML IV SOLN
5.0000 mg | Freq: Once | INTRAVENOUS | Status: AC
  Administered 2015-06-26: 5 mg via INTRAVENOUS
  Filled 2015-06-26: qty 5

## 2015-06-26 MED ORDER — IOHEXOL 350 MG/ML SOLN
80.0000 mL | Freq: Once | INTRAVENOUS | Status: AC | PRN
  Administered 2015-06-26: 80 mL via INTRAVENOUS

## 2015-06-26 MED ORDER — METOPROLOL TARTRATE 1 MG/ML IV SOLN
INTRAVENOUS | Status: AC
  Filled 2015-06-26: qty 5

## 2015-06-26 MED ORDER — COLCHICINE 0.6 MG PO TABS: 0.6000 mg | ORAL_TABLET | Freq: Every day | ORAL | Status: DC

## 2015-06-26 NOTE — Assessment & Plan Note (Signed)
>>  ASSESSMENT AND PLAN FOR GOUT WRITTEN ON 06/26/2015  1:47 PM BY CALONE, GREGORY D, FNP  Gout appears adequately controlled with current regimen of colchicine. No adverse side effects or gout flares. Continue current dosage of colchicine and consider switching to allopurinol within the next 2 months. Obtain uric acid at next office visit.

## 2015-06-26 NOTE — Progress Notes (Signed)
Pre visit review using our clinic review tool, if applicable. No additional management support is needed unless otherwise documented below in the visit note. 

## 2015-06-26 NOTE — Progress Notes (Signed)
CT scan completed. Tolerated well. D/C home walking. Awake and alert. In no distress. 

## 2015-06-26 NOTE — Patient Instructions (Signed)
Thank you for choosing [facility].  Summary/Instructions:  Good luck with your ablation.  Your prescription(s) have been submitted to your pharmacy or been printed and provided for you. Please take as directed and contact our office if you believe you are having problem(s) with the medication(s) or have any questions.  If your symptoms worsen or fail to improve, please contact our office for further instruction, or in case of emergency go directly to the emergency room at the closest medical facility.   Gout Gout is an inflammatory arthritis caused by a buildup of uric acid crystals in the joints. Uric acid is a chemical that is normally present in the blood. When the level of uric acid in the blood is too high it can form crystals that deposit in your joints and tissues. This causes joint redness, soreness, and swelling (inflammation). Repeat attacks are common. Over time, uric acid crystals can form into masses (tophi) near a joint, destroying bone and causing disfigurement. Gout is treatable and often preventable. CAUSES  The disease begins with elevated levels of uric acid in the blood. Uric acid is produced by your body when it breaks down a naturally found substance called purines. Certain foods you eat, such as meats and fish, contain high amounts of purines. Causes of an elevated uric acid level include:  Being passed down from parent to child (heredity).  Diseases that cause increased uric acid production (such as obesity, psoriasis, and certain cancers).  Excessive alcohol use.  Diet, especially diets rich in meat and seafood.  Medicines, including certain cancer-fighting medicines (chemotherapy), water pills (diuretics), and aspirin.  Chronic kidney disease. The kidneys are no longer able to remove uric acid well.  Problems with metabolism. Conditions strongly associated with gout include:  Obesity.  High blood pressure.  High cholesterol.  Diabetes. Not  everyone with elevated uric acid levels gets gout. It is not understood why some people get gout and others do not. Surgery, joint injury, and eating too much of certain foods are some of the factors that can lead to gout attacks. SYMPTOMS   An attack of gout comes on quickly. It causes intense pain with redness, swelling, and warmth in a joint.  Fever can occur.  Often, only one joint is involved. Certain joints are more commonly involved:  Base of the big toe.  Knee.  Ankle.  Wrist.  Finger. Without treatment, an attack usually goes away in a few days to weeks. Between attacks, you usually will not have symptoms, which is different from many other forms of arthritis. DIAGNOSIS  Your caregiver will suspect gout based on your symptoms and exam. In some cases, tests may be recommended. The tests may include:  Blood tests.  Urine tests.  X-rays.  Joint fluid exam. This exam requires a needle to remove fluid from the joint (arthrocentesis). Using a microscope, gout is confirmed when uric acid crystals are seen in the joint fluid. TREATMENT  There are two phases to gout treatment: treating the sudden onset (acute) attack and preventing attacks (prophylaxis).  Treatment of an Acute Attack.  Medicines are used. These include anti-inflammatory medicines or steroid medicines.  An injection of steroid medicine into the affected joint is sometimes necessary.  The painful joint is rested. Movement can worsen the arthritis.  You may use warm or cold treatments on painful joints, depending which works best for you.  Treatment to Prevent Attacks.  If you suffer from frequent gout attacks, your caregiver may advise preventive medicine. These  medicines are started after the acute attack subsides. These medicines either help your kidneys eliminate uric acid from your body or decrease your uric acid production. You may need to stay on these medicines for a very long time.  The early phase  of treatment with preventive medicine can be associated with an increase in acute gout attacks. For this reason, during the first few months of treatment, your caregiver may also advise you to take medicines usually used for acute gout treatment. Be sure you understand your caregiver's directions. Your caregiver may make several adjustments to your medicine dose before these medicines are effective.  Discuss dietary treatment with your caregiver or dietitian. Alcohol and drinks high in sugar and fructose and foods such as meat, poultry, and seafood can increase uric acid levels. Your caregiver or dietitian can advise you on drinks and foods that should be limited. HOME CARE INSTRUCTIONS   Do not take aspirin to relieve pain. This raises uric acid levels.  Only take over-the-counter or prescription medicines for pain, discomfort, or fever as directed by your caregiver.  Rest the joint as much as possible. When in bed, keep sheets and blankets off painful areas.  Keep the affected joint raised (elevated).  Apply warm or cold treatments to painful joints. Use of warm or cold treatments depends on which works best for you.  Use crutches if the painful joint is in your leg.  Drink enough fluids to keep your urine clear or pale yellow. This helps your body get rid of uric acid. Limit alcohol, sugary drinks, and fructose drinks.  Follow your dietary instructions. Pay careful attention to the amount of protein you eat. Your daily diet should emphasize fruits, vegetables, whole grains, and fat-free or low-fat milk products. Discuss the use of coffee, vitamin C, and cherries with your caregiver or dietitian. These may be helpful in lowering uric acid levels.  Maintain a healthy body weight. SEEK MEDICAL CARE IF:   You develop diarrhea, vomiting, or any side effects from medicines.  You do not feel better in 24 hours, or you are getting worse. SEEK IMMEDIATE MEDICAL CARE IF:   Your joint becomes  suddenly more tender, and you have chills or a fever. MAKE SURE YOU:   Understand these instructions.  Will watch your condition.  Will get help right away if you are not doing well or get worse.   This information is not intended to replace advice given to you by your health care provider. Make sure you discuss any questions you have with your health care provider.   Document Released: 03/28/2000 Document Revised: 04/21/2014 Document Reviewed: 11/12/2011 Elsevier Interactive Patient Education [employer].

## 2015-06-26 NOTE — Assessment & Plan Note (Signed)
Paroxysmal atrial fibrillation currently managed with flecainide and metoprolol. Anticoagulated with Eliquis. Auscultation today with normal rate and rhythm. Continue current dosage of Eliquis, metoprolol, and flecainide. Follow up with cardiology for ablation as scheduled.

## 2015-06-26 NOTE — Progress Notes (Signed)
Subjective:    Patient ID: Daniel Orozco, male    DOB: Mar 27, 1971, 45 y.o.   MRN: [id number]  Chief Complaint  Patient presents with  . Establish Care    has had 3 flare ups of gout in the last month and his uric acid levels have been normal, curious as to what could be causing it, is having an ablation done just had a cardiac CT    HPI:  Daniel Orozco is a 45 y.o. male who  has a past medical history of Arthritis; S/P balloon dilatation of esophageal stricture; Crohn disease ([place]); and Paroxysmal atrial fibrillation ([place]). and presents today for an office visit to establish care.   1.) Gout - Associated symptoms of gout flares have been going on for approximately 3 months and located in his bilateral ankles. Modifying factors include colchicine used for flare and prophylaxis which has helped decrease his gout flares. Frequency of gout flares is 3 since the start of 2017. He has also adjusted his diet to accommodate for gout flares. Previous uric acid level was 7.1 done at an outside facility.   2.) Atrial fibrillation - previously diagnosed with atrial fibrillation and currently managed on antiarrhythmic and rate controlling medications which he takes as prescribed and denies adverse side effects. He has failed conservative therapy and multiple cardioversions. He is scheduled for ablation towards the end of this week. He is anticoagulated with Eliquis and indicates no chest pain, shortness of breath, calf pain, or nuisance bleeding.  Allergies  Allergen Reactions  . Codeine Other (See Comments)    REACTION: upsets stomach  . Other Swelling    Honey dew melon causes throat swelling     Outpatient Prescriptions Prior to Visit  Medication Sig Dispense Refill  . apixaban (ELIQUIS) 5 MG TABS tablet Take 1 tablet (5 mg total) by mouth 2 (two) times daily. 60 tablet 1  . flecainide (TAMBOCOR) 50 MG tablet Take 1 tablet (50 mg total) by mouth 2 (two) times daily. 60 tablet 3  .  metoprolol succinate (TOPROL-XL) 50 MG 24 hr tablet Take 1 tablet (50 mg total) by mouth daily. 90 tablet 3  . colchicine 0.6 MG tablet Take 1 tablet (0.6 mg total) by mouth daily. At first sign of gout flair, take 2 tabs pox1 and 1 tab 1 hr later. Do not use >3 tabs in 24 hr 60 tablet 0  . HYDROcodone-acetaminophen (NORCO/VICODIN) 5-325 MG tablet Take 1 tablet by mouth every 6 (six) hours as needed for moderate pain. 30 tablet 0  . predniSONE (DELTASONE) 20 MG tablet Take 2 tablets (40 mg total) by mouth daily with breakfast. 10 tablet 0   Facility-Administered Medications Prior to Visit  Medication Dose Route Frequency Provider Last Rate Last Dose  . metoprolol (LOPRESSOR) 1 MG/ML injection              Past Medical History  Diagnosis Date  . Arthritis   . S/P balloon dilatation of esophageal stricture   . Crohn disease ([place])     in remission since 2000  . Paroxysmal atrial fibrillation [facility])      Past Surgical History  Procedure Laterality Date  . Fracture surgery    . Esophageal dilation    . Tee without cardioversion N/A 03/28/2015    Procedure: TRANSESOPHAGEAL ECHOCARDIOGRAM (TEE);  Surgeon: Dorothy Spark, MD;  Location: [place];  Service: Cardiovascular;  Laterality: N/A;  . Cardioversion N/A 03/28/2015    Procedure: CARDIOVERSION;  Surgeon: Houston Siren  Hazel Sams, MD;  [facility] ENDOSCOPY;  Service: Cardiovascular;  Laterality: N/A;     Family History  Problem Relation Age of Onset  . Heart disease Maternal Grandfather   . Heart disease Father     RBBB  . Healthy Maternal Grandmother   . Healthy Paternal Grandmother   . Healthy Paternal Grandfather   . Healthy Mother      Social History   Social History  . Marital Status: Married    Spouse Name: N/A  . Number of Children: 3  . Years of Education: 14   Occupational History  . Mechanic    Social History Main Topics  . Smoking status: Former Smoker -- 1.00 packs/day for 2 years  . Smokeless  tobacco: Never Used     Comment: quit in 1999, only smoked for 2 years  . Alcohol Use: No  . Drug Use: No  . Sexual Activity: Yes    Birth Control/ Protection: Other-see comments   Other Topics Concern  . Not on file   Social History Narrative   Fun: Soccer   Works at [employer] as their [employer]      Review of Systems  Constitutional: Negative for fever and chills.  Respiratory: Negative for chest tightness and shortness of breath.   Cardiovascular: Negative for chest pain, palpitations and leg swelling.  Musculoskeletal: Negative for arthralgias.  Neurological: Negative for headaches.      Objective:    BP 112/72 mmHg  Pulse 80  Temp(Src) 98.1 F (36.7 C) (Oral)  Resp 14  Ht 6' (1.829 m)  Wt 184 lb (83.462 kg)  BMI 24.95 kg/m2  SpO2 96% Nursing note and vital signs reviewed.  Physical Exam  Constitutional: He is oriented to person, place, and time. He appears well-developed and well-nourished. No distress.  Cardiovascular: Normal rate, regular rhythm, normal heart sounds and intact distal pulses.   Pulmonary/Chest: Effort normal and breath sounds normal.  Neurological: He is alert and oriented to person, place, and time.  Skin: Skin is warm and dry.  Psychiatric: He has a normal mood and affect. His behavior is normal. Judgment and thought content normal.       Assessment & Plan:   Problem List Items Addressed This Visit      Cardiovascular and Mediastinum   PAF (paroxysmal atrial fibrillation) (HCC)    Paroxysmal atrial fibrillation currently managed with flecainide and metoprolol. Anticoagulated with Eliquis. Auscultation today with normal rate and rhythm. Continue current dosage of Eliquis, metoprolol, and flecainide. Follow up with cardiology for ablation as scheduled.         Other   Gout - Primary    Gout appears adequately controlled with current regimen of colchicine. No adverse side effects or gout flares. Continue current  dosage of colchicine and consider switching to allopurinol within the next 2 months. Obtain uric acid at next office visit.      Relevant Medications   colchicine 0.6 MG tablet

## 2015-06-26 NOTE — Assessment & Plan Note (Signed)
Gout appears adequately controlled with current regimen of colchicine. No adverse side effects or gout flares. Continue current dosage of colchicine and consider switching to allopurinol within the next 2 months. Obtain uric acid at next office visit.

## 2015-06-27 ENCOUNTER — Telehealth: Payer: Self-pay | Admitting: Internal Medicine

## 2015-06-27 NOTE — Telephone Encounter (Signed)
Pt called and has some questions about his 3/21 proc. Please give him a call back.

## 2015-06-27 NOTE — Telephone Encounter (Signed)
Spoke with patient and let him know to be at the hospital at 5:30am.  NPO after midnight the night prior and no medications the morning of the procedure.  Labs and CT done prior.

## 2015-07-03 ENCOUNTER — Ambulatory Visit (HOSPITAL_COMMUNITY)
Admission: RE | Admit: 2015-07-03 | Discharge: 2015-07-04 | Disposition: A | Discharge status: Home / Self Care | Payer: 59 | Source: Ambulatory Visit | Attending: Internal Medicine | Admitting: Internal Medicine

## 2015-07-03 ENCOUNTER — Ambulatory Visit (HOSPITAL_COMMUNITY): Payer: 59 | Admitting: Certified Registered Nurse Anesthetist

## 2015-07-03 ENCOUNTER — Encounter (HOSPITAL_COMMUNITY)
Admission: RE | Disposition: A | Discharge status: Home / Self Care | Payer: Self-pay | Source: Ambulatory Visit | Attending: Internal Medicine

## 2015-07-03 ENCOUNTER — Encounter (HOSPITAL_COMMUNITY): Payer: Self-pay | Admitting: Internal Medicine

## 2015-07-03 DIAGNOSIS — K5090 Crohn's disease, unspecified, without complications: Secondary | ICD-10-CM | POA: Insufficient documentation

## 2015-07-03 DIAGNOSIS — K219 Gastro-esophageal reflux disease without esophagitis: Secondary | ICD-10-CM | POA: Insufficient documentation

## 2015-07-03 DIAGNOSIS — J45909 Unspecified asthma, uncomplicated: Secondary | ICD-10-CM | POA: Diagnosis not present

## 2015-07-03 DIAGNOSIS — M1990 Unspecified osteoarthritis, unspecified site: Secondary | ICD-10-CM | POA: Insufficient documentation

## 2015-07-03 DIAGNOSIS — Z7901 Long term (current) use of anticoagulants: Secondary | ICD-10-CM | POA: Diagnosis not present

## 2015-07-03 DIAGNOSIS — I4891 Unspecified atrial fibrillation: Secondary | ICD-10-CM | POA: Diagnosis present

## 2015-07-03 DIAGNOSIS — K222 Esophageal obstruction: Secondary | ICD-10-CM | POA: Diagnosis not present

## 2015-07-03 DIAGNOSIS — Z8249 Family history of ischemic heart disease and other diseases of the circulatory system: Secondary | ICD-10-CM | POA: Insufficient documentation

## 2015-07-03 DIAGNOSIS — I480 Paroxysmal atrial fibrillation: Secondary | ICD-10-CM | POA: Diagnosis not present

## 2015-07-03 DIAGNOSIS — Z87891 Personal history of nicotine dependence: Secondary | ICD-10-CM | POA: Diagnosis not present

## 2015-07-03 HISTORY — PX: ELECTROPHYSIOLOGIC STUDY: SHX172A

## 2015-07-03 LAB — POCT ACTIVATED CLOTTING TIME
ACTIVATED CLOTTING TIME: 240 s
ACTIVATED CLOTTING TIME: 281 s
Activated Clotting Time: 250 seconds
Activated Clotting Time: 162 seconds

## 2015-07-03 LAB — MRSA PCR SCREENING: MRSA BY PCR: NEGATIVE

## 2015-07-03 SURGERY — ATRIAL FIBRILLATION ABLATION
Anesthesia: General

## 2015-07-03 MED ORDER — FENTANYL CITRATE (PF) 100 MCG/2ML IJ SOLN
INTRAMUSCULAR | Status: DC | PRN
  Administered 2015-07-03: 25 ug via INTRAVENOUS
  Administered 2015-07-03: 75 ug via INTRAVENOUS

## 2015-07-03 MED ORDER — DOBUTAMINE IN D5W 4-5 MG/ML-% IV SOLN
INTRAVENOUS | Status: AC
  Filled 2015-07-03: qty 250

## 2015-07-03 MED ORDER — SODIUM CHLORIDE 0.9 % IV SOLN
INTRAVENOUS | Status: DC
  Administered 2015-07-03 (×2): via INTRAVENOUS

## 2015-07-03 MED ORDER — LIDOCAINE HCL (PF) 1 % IJ SOLN
INTRAMUSCULAR | Status: AC
  Filled 2015-07-03: qty 60

## 2015-07-03 MED ORDER — HYDROCODONE-ACETAMINOPHEN 5-325 MG PO TABS
1.0000 | ORAL_TABLET | ORAL | Status: DC | PRN
  Administered 2015-07-03: 1 via ORAL
  Administered 2015-07-04: 2 via ORAL
  Filled 2015-07-03: qty 1
  Filled 2015-07-03: qty 2

## 2015-07-03 MED ORDER — ACETAMINOPHEN 325 MG PO TABS
650.0000 mg | ORAL_TABLET | ORAL | Status: DC | PRN
  Administered 2015-07-03 – 2015-07-04 (×2): 650 mg via ORAL
  Filled 2015-07-03 (×2): qty 2

## 2015-07-03 MED ORDER — PROTAMINE SULFATE 10 MG/ML IV SOLN
INTRAVENOUS | Status: DC | PRN
  Administered 2015-07-03: 10 mg via INTRAVENOUS
  Administered 2015-07-03: 20 mg via INTRAVENOUS

## 2015-07-03 MED ORDER — ONDANSETRON HCL 4 MG/2ML IJ SOLN
INTRAMUSCULAR | Status: AC
  Filled 2015-07-03: qty 2

## 2015-07-03 MED ORDER — DEXAMETHASONE SODIUM PHOSPHATE 4 MG/ML IJ SOLN
INTRAMUSCULAR | Status: DC | PRN
  Administered 2015-07-03: 8 mg via INTRAVENOUS

## 2015-07-03 MED ORDER — HEPARIN SODIUM (PORCINE) 1000 UNIT/ML IJ SOLN
INTRAMUSCULAR | Status: DC | PRN
  Administered 2015-07-03: 1000 [IU] via INTRAVENOUS
  Administered 2015-07-03: 12000 [IU] via INTRAVENOUS
  Administered 2015-07-03: 1000 [IU] via INTRAVENOUS

## 2015-07-03 MED ORDER — APIXABAN 5 MG PO TABS
5.0000 mg | ORAL_TABLET | Freq: Two times a day (BID) | ORAL | Status: DC
  Administered 2015-07-03 – 2015-07-04 (×2): 5 mg via ORAL
  Filled 2015-07-03 (×2): qty 1

## 2015-07-03 MED ORDER — IOPAMIDOL (ISOVUE-370) INJECTION 76%
INTRAVENOUS | Status: DC | PRN
  Administered 2015-07-03: 5 mL

## 2015-07-03 MED ORDER — SODIUM CHLORIDE 0.9% FLUSH
3.0000 mL | Freq: Two times a day (BID) | INTRAVENOUS | Status: DC
  Administered 2015-07-03: 3 mL via INTRAVENOUS

## 2015-07-03 MED ORDER — OXYCODONE HCL 5 MG PO TABS
5.0000 mg | ORAL_TABLET | Freq: Once | ORAL | Status: AC | PRN
  Administered 2015-07-03: 5 mg via ORAL
  Filled 2015-07-03: qty 1

## 2015-07-03 MED ORDER — HEPARIN SODIUM (PORCINE) 1000 UNIT/ML IJ SOLN
INTRAMUSCULAR | Status: AC
  Filled 2015-07-03: qty 1

## 2015-07-03 MED ORDER — PHENYLEPHRINE HCL 10 MG/ML IJ SOLN
10.0000 mg | INTRAVENOUS | Status: DC | PRN
  Administered 2015-07-03: 10 ug/min via INTRAVENOUS

## 2015-07-03 MED ORDER — ONDANSETRON HCL 4 MG/2ML IJ SOLN: 4.0000 mg | Freq: Once | INTRAMUSCULAR | Status: DC | PRN

## 2015-07-03 MED ORDER — BUPIVACAINE HCL (PF) 0.25 % IJ SOLN
INTRAMUSCULAR | Status: AC
  Filled 2015-07-03: qty 60

## 2015-07-03 MED ORDER — OXYCODONE HCL 5 MG/5ML PO SOLN: 5.0000 mg | Freq: Once | ORAL | Status: AC | PRN

## 2015-07-03 MED ORDER — BUPIVACAINE HCL (PF) 0.25 % IJ SOLN
INTRAMUSCULAR | Status: DC | PRN
  Administered 2015-07-03: 30 mL

## 2015-07-03 MED ORDER — SODIUM CHLORIDE 0.9% FLUSH: 3.0000 mL | INTRAVENOUS | Status: DC | PRN

## 2015-07-03 MED ORDER — LIDOCAINE HCL (CARDIAC) 20 MG/ML IV SOLN
INTRAVENOUS | Status: DC | PRN
  Administered 2015-07-03: 40 mg via INTRAVENOUS

## 2015-07-03 MED ORDER — PROPOFOL 10 MG/ML IV BOLUS
INTRAVENOUS | Status: DC | PRN
Start: 2015-07-03 — End: 2015-07-03
  Administered 2015-07-03: 180 mg via INTRAVENOUS

## 2015-07-03 MED ORDER — DOBUTAMINE IN D5W 4-5 MG/ML-% IV SOLN
INTRAVENOUS | Status: DC | PRN
  Administered 2015-07-03: 20 ug/kg/min via INTRAVENOUS

## 2015-07-03 MED ORDER — SODIUM CHLORIDE 0.9 % IV SOLN: 250.0000 mL | INTRAVENOUS | Status: DC | PRN

## 2015-07-03 MED ORDER — IOPAMIDOL (ISOVUE-370) INJECTION 76%
INTRAVENOUS | Status: AC
  Filled 2015-07-03: qty 50

## 2015-07-03 MED ORDER — MIDAZOLAM HCL 5 MG/5ML IJ SOLN
INTRAMUSCULAR | Status: DC | PRN
  Administered 2015-07-03: 2 mg via INTRAVENOUS

## 2015-07-03 MED ORDER — ONDANSETRON HCL 4 MG/2ML IJ SOLN
4.0000 mg | Freq: Four times a day (QID) | INTRAMUSCULAR | Status: DC | PRN
  Administered 2015-07-03: 4 mg via INTRAVENOUS

## 2015-07-03 MED ORDER — HEPARIN SODIUM (PORCINE) 1000 UNIT/ML IJ SOLN
INTRAMUSCULAR | Status: DC | PRN
  Administered 2015-07-03: 5000 [IU] via INTRAVENOUS
  Administered 2015-07-03: 12000 [IU] via INTRAVENOUS
  Administered 2015-07-03: 6000 [IU] via INTRAVENOUS

## 2015-07-03 MED ORDER — FENTANYL CITRATE (PF) 100 MCG/2ML IJ SOLN: 25.0000 ug | INTRAMUSCULAR | Status: DC | PRN

## 2015-07-03 SURGICAL SUPPLY — 18 items
BAG SNAP BAND KOVER 36X36 (MISCELLANEOUS) ×2 IMPLANT
BLANKET WARM UNDERBOD FULL ACC (MISCELLANEOUS) ×2 IMPLANT
CATH NAVISTAR SMARTTOUCH DF (ABLATOR) ×2 IMPLANT
CATH SOUNDSTAR 3D IMAGING (CATHETERS) ×2 IMPLANT
CATH VARIABLE LASSO NAV 2515 (CATHETERS) ×2 IMPLANT
CATH WEBSTER BI DIR CS D-F CRV (CATHETERS) ×2 IMPLANT
COVER SWIFTLINK CONNECTOR (BAG) ×2 IMPLANT
NEEDLE TRANSEP BRK 71CM 407200 (NEEDLE) ×2 IMPLANT
PACK EP LATEX FREE (CUSTOM PROCEDURE TRAY) ×1
PACK EP LF (CUSTOM PROCEDURE TRAY) ×1 IMPLANT
PAD DEFIB LIFELINK (PAD) ×2 IMPLANT
PATCH CARTO3 (PAD) ×2 IMPLANT
SHEATH AVANTI 11F 11CM (SHEATH) ×2 IMPLANT
SHEATH PINNACLE 7F 10CM (SHEATH) ×4 IMPLANT
SHEATH PINNACLE 9F 10CM (SHEATH) ×2 IMPLANT
SHEATH SWARTZ TS SL2 63CM 8.5F (SHEATH) ×2 IMPLANT
SHIELD RADPAD SCOOP 12X17 (MISCELLANEOUS) ×2 IMPLANT
TUBING SMART ABLATE COOLFLOW (TUBING) ×2 IMPLANT

## 2015-07-03 NOTE — Anesthesia Postprocedure Evaluation (Signed)
Anesthesia Post Note  Patient: Daniel Orozco  Procedure(s) Performed: Procedure(s) (LRB): Atrial Fibrillation Ablation (N/A)  Patient location during evaluation: PACU Anesthesia Type: General Level of consciousness: awake and alert Pain management: pain level controlled Vital Signs Assessment: post-procedure vital signs reviewed and stable Respiratory status: spontaneous breathing, nonlabored ventilation, respiratory function stable and patient connected to nasal cannula oxygen Cardiovascular status: blood pressure returned to baseline and stable Postop Assessment: no signs of nausea or vomiting Anesthetic complications: no    Last Vitals:  Filed Vitals:   07/03/15 1200 07/03/15 1205  BP: 111/55 91/52  Pulse: 88 87  Temp:    Resp: 16 15    Last Pain: There were no vitals filed for this visit.               Cote Mayabb,W. EDMOND

## 2015-07-03 NOTE — Anesthesia Preprocedure Evaluation (Addendum)
Anesthesia Evaluation  Patient identified by MRN, date of birth, ID band Patient awake    Reviewed: Allergy & Precautions, NPO status , Patient's Chart, lab work & pertinent test results  History of Anesthesia Complications Negative for: history of anesthetic complications  Airway Mallampati: II  TM Distance: >3 FB Neck ROM: Full    Dental  (+) Dental Advisory Given   Pulmonary asthma , former smoker,    breath sounds clear to auscultation- rhonchi       Cardiovascular + dysrhythmias Atrial Fibrillation  Rhythm:Regular Rate:Normal     Neuro/Psych PSYCHIATRIC DISORDERS  Neuromuscular disease    GI/Hepatic GERD  Medicated,  Endo/Other    Renal/GU      Musculoskeletal  (+) Arthritis , Osteoarthritis,    Abdominal   Peds  Hematology   Anesthesia Other Findings   Reproductive/Obstetrics                            BP Readings from Last 3 Encounters:  07/03/15 124/80  06/26/15 112/72  06/26/15 118/80   CBC    Component Value Date/Time   WBC 4.3 06/18/2015 1137   RBC 4.81 06/18/2015 1137   HGB 15.1 06/18/2015 1137   HCT 41.0 06/18/2015 1137   PLT 223 06/18/2015 1137   MCV 85.2 06/18/2015 1137   MCH 31.4 06/18/2015 1137   MCHC 36.8* 06/18/2015 1137   RDW 12.3 06/18/2015 1137   LYMPHSABS 1.4 06/18/2015 1137   MONOABS 0.5 06/18/2015 1137   EOSABS 0.3 06/18/2015 1137   BASOSABS 0.0 06/18/2015 1137      Chemistry      Component Value Date/Time   NA 142 06/18/2015 1137   K 4.2 06/18/2015 1137   CL 105 06/18/2015 1137   CO2 28 06/18/2015 1137   BUN 12 06/18/2015 1137   CREATININE 0.68 06/18/2015 1137   CREATININE 0.89 03/28/2015 0229      Component Value Date/Time   CALCIUM 9.4 06/18/2015 1137   ALKPHOS 56 03/27/2015 1152   AST 19 03/27/2015 1152   ALT 28 03/27/2015 1152   BILITOT 1.5* 03/27/2015 1152     Echo 03/28/15: Study Conclusions  - Left ventricle: The cavity size  was normal. Wall thickness was normal. Systolic function was normal. The estimated ejection fraction was in the range of 55% to 60%. Wall motion was normal; there were no regional wall motion abnormalities. - Aortic valve: No evidence of vegetation. - Mitral valve: No evidence of vegetation. - Left atrium: No evidence of thrombus in the atrial cavity or appendage. - Right atrium: No evidence of thrombus in the atrial cavity or appendage. No evidence of thrombus in the appendage. - Atrial septum: No defect or patent foramen ovale was identified. Echo contrast study showed no right-to-left atrial level shunt, following an increase in RA pressure induced by provocative maneuvers. - Pulmonic valve: No evidence of vegetation.  EKG 05/15/15 Normal sinus rhythm Left axis deviation Abnormal ECG No significant change since last tracing Confirmed by [facility] MD, PHILIP   Anesthesia Physical Anesthesia Plan  ASA: III  Anesthesia Plan: General   Post-op Pain Management:    Induction: Intravenous  Airway Management Planned: LMA  Additional Equipment:   Intra-op Plan:   Post-operative Plan:   Informed Consent: I have reviewed the patients History and Physical, chart, labs and discussed the procedure including the risks, benefits and alternatives for the proposed anesthesia with the patient or authorized representative who has indicated his/her  understanding and acceptance.   Dental advisory given  Plan Discussed with: CRNA and Anesthesiologist  Anesthesia Plan Comments:         Anesthesia Quick Evaluation

## 2015-07-03 NOTE — Discharge Summary (Signed)
ELECTROPHYSIOLOGY PROCEDURE DISCHARGE SUMMARY    Patient ID: Daniel Orozco,  MRN: [id number], DOB/AGE: 46-Jan-1972 46 y.o.  Admit date: 07/03/2015 Discharge date: 07/04/2015  Primary Care Physician: [name], [place] Primary Cardiologist: Tamala Julian Electrophysiologist: [name], MD  Primary Discharge Diagnosis:  Paroxysmal atrial fibrillation status post ablation this admission  Secondary Discharge Diagnosis:  1.  Arthritis 2.  Esophageal stricture 3.  Chron's disease  Procedures This Admission:  1.  Electrophysiology study and radiofrequency catheter ablation on 07/03/15 by Dr [name].  This study demonstrated sinus rhythm upon presentation; intracardiac echo reveals a normal sized left atrium with four separate pulmonary veins without evidence of pulmonary vein stenosis; successful electrical isolation and anatomical encircling of all four pulmonary veins with radiofrequency current; no inducible arrhythmias following ablation both on and off of dobutamine; no early apparent complications.  Brief HPI: Daniel Orozco is a 45 y.o. male with a history of paroxysmal atrial fibrillation.  They have failed medical therapy with Flecainide. Risks, benefits, and alternatives to catheter ablation of atrial fibrillation were reviewed with the patient who wished to proceed.  The patient underwent cardiac CT scan prior to the procedure which demonstrated no LAA thrombus.    Hospital Course:  The patient was admitted and underwent EPS/RFCA of atrial fibrillation with details as outlined above.  They were monitored on telemetry overnight which demonstrated sinus rhythm.  Groin was without complication on the day of discharge.  The patient was examined and considered to be stable for discharge.  Wound care and restrictions were reviewed with the patient.  The patient will be seen back by Roderic Palau, NP in 4 weeks and Dr Rayann Heman in 12 weeks for post ablation follow up.   This patients  CHA2DS2-VASc Score and unadjusted Ischemic Stroke Rate (% per year) is equal to 0.2 % stroke rate/year from a score of 0 Above score calculated as 1 point each if present [CHF, HTN, DM, Vascular=MI/PAD/Aortic Plaque, Age if 65-74, or Male] Above score calculated as 2 points each if present [Age > 75, or Stroke/TIA/TE]   Physical Exam: Filed Vitals:   07/04/15 0100 07/04/15 0200 07/04/15 0300 07/04/15 0700  BP: 103/70 105/69 96/75 103/77  Pulse:      Temp:      TempSrc:      Resp: 25 13 13 14   Height:      Weight:      SpO2: 100% 100% 99% 100%    GEN- The patient is well appearing, alert and oriented x 3 today.   HEENT: normocephalic, atraumatic; sclera clear, conjunctiva pink; hearing intact; oropharynx clear; neck supple  Lungs- Normal work of breathing, clear to auscultation bilaterally  Heart- Regular rate and rhythm, no murmurs, rubs or gallops  GI- soft, non-tender, non-distended, bowel sounds present  Extremities- no clubbing, cyanosis, or edema; DP/PT/radial pulses 2+ bilaterally, groin without hematoma/bruit MS- no significant deformity or atrophy Skin- warm and dry, no rash or lesion Psych- euthymic mood, full affect Neuro- strength and sensation are intact   Labs:   Lab Results  Component Value Date   WBC 4.3 06/18/2015   HGB 15.1 06/18/2015   HCT 41.0 06/18/2015   MCV 85.2 06/18/2015   PLT 223 06/18/2015   No results for input(s): NA, K, CL, CO2, BUN, CREATININE, CALCIUM, PROT, BILITOT, ALKPHOS, ALT, AST, GLUCOSE in the last 168 hours.  Invalid input(s): LABALBU   Discharge Medications:    Medication List    STOP taking these medications  flecainide 50 MG tablet  Commonly known as:  TAMBOCOR     metoprolol succinate 50 MG 24 hr tablet  Commonly known as:  TOPROL-XL      TAKE these medications        apixaban 5 MG Tabs tablet  Commonly known as:  ELIQUIS  Take 1 tablet (5 mg total) by mouth 2 (two) times daily.     colchicine 0.6 MG  tablet  Take 1 tablet (0.6 mg total) by mouth daily. At first sign of gout flair, take 2 tabs pox1 and 1 tab 1 hr later. Do not use >3 tabs in 24 hr     pantoprazole 40 MG tablet  Commonly known as:  PROTONIX  Take 1 tablet (40 mg total) by mouth daily.        Disposition:  Discharge Instructions    Diet - low sodium heart healthy    Complete by:  As directed      Discharge instructions    Complete by:  As directed   No driving for 4 days. No lifting over 5 lbs for 1 week. No sexual activity for 1 week. You may return to work in 1 week. Keep procedure site clean & dry. If you notice increased pain, swelling, bleeding or pus, call/return!  You may shower, but no soaking baths/hot tubs/pools for 1 week.     Increase activity slowly    Complete by:  As directed           Follow-up Information    Follow up with [facility] On 07/31/2015.   Specialty:  Cardiology   Why:  at [facility] information:   [id number] [place] [phone]      Follow up with [name], MD On 10/03/2015.   Specialty:  Cardiology   Why:  at 9:45AM    Contact information:   [place] [address] [phone]       Duration of Discharge Encounter: Greater than 30 minutes including physician time.  Signed, Chanetta Marshall, NP 07/04/2015 8:03 AM    I have seen, examined the patient, and reviewed the above assessment and plan.  On exam, RRR.  No focal neuro changes.  Changes to above are made where necessary.    Co Sign: [name], MD 07/04/2015 8:57 AM

## 2015-07-03 NOTE — Progress Notes (Signed)
59fr, 9Fr, and 6fr venous sheaths pulled from R groin by Tessie Eke. Manual pressure held for 30 minutes. Site is level 0 upon completion. No hematoma or bleeding noted. Dressed with 4x4 and tegaderm.

## 2015-07-03 NOTE — Transfer of Care (Signed)
Immediate Anesthesia Transfer of Care Note  Patient: Daniel Orozco  Procedure(s) Performed: Procedure(s): Atrial Fibrillation Ablation (N/A)  Patient Location: PACU  Anesthesia Type:General  Level of Consciousness: awake, alert , oriented and patient cooperative  Airway & Oxygen Therapy: Patient Spontanous Breathing and Patient connected to face mask  Post-op Assessment: Report given to RN, Post -op Vital signs reviewed and stable and Patient moving all extremities X 4  Post vital signs: Reviewed and stable  Last Vitals:  Filed Vitals:   07/03/15 0539  BP: 124/80  Pulse: 78  Temp: 36.6 C  Resp: 20    Complications: No apparent anesthesia complications

## 2015-07-03 NOTE — Anesthesia Procedure Notes (Signed)
Procedure Name: LMA Insertion Date/Time: 07/03/2015 9:09 AM Performed by: Layla Maw Pre-anesthesia Checklist: Patient identified, Patient being monitored, Timeout performed, Emergency Drugs available and Suction available Patient Re-evaluated:Patient Re-evaluated prior to inductionOxygen Delivery Method: Circle System Utilized Preoxygenation: Pre-oxygenation with 100% oxygen Intubation Type: IV induction Ventilation: Mask ventilation without difficulty LMA: LMA inserted LMA Size: 5.0 Number of attempts: 1 Placement Confirmation: positive ETCO2 and breath sounds checked- equal and bilateral Tube secured with: Tape Dental Injury: Teeth and Oropharynx as per pre-operative assessment

## 2015-07-03 NOTE — H&P (Signed)
PCP: Cathlean Cower, MD Cardiologist: Dr Tamala Julian Primary Electrophysiologist: [name], MD   Chief Complaint  Patient presents with  . Atrial Fibrillation    History of Present Illness: Daniel Orozco is a 45 y.o. male who presents today for afib ablation. He reports initially being diagnosed with atrial fibrillation 12/16 after presenting with tachypalpitations and fatigue. In restrospect, he feels that he has had afib longer than this. He was evaluated and underwent cardioversion in the ED. He has been treated with flecainide but continues to have episodic afib. He reports fatigue with metoprolol and flecainide. 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 Left atrium is normal in size.  Today, he denies symptoms of palpitations, chest pain, shortness of breath, orthopnea, PND, lower extremity edema, claudication, dizziness, presyncope, syncope, bleeding, or neurologic sequela. The patient is tolerating medications without difficulties and is otherwise without complaint today.    Past Medical History  Diagnosis Date  . Arthritis   . S/P balloon dilatation of esophageal stricture   . Crohn disease ([place])     in remission since 2000   Past Surgical History  Procedure Laterality Date  . Fracture surgery    . Esophageal dilation    . Tee without cardioversion N/A 03/28/2015    Procedure: TRANSESOPHAGEAL ECHOCARDIOGRAM (TEE); Surgeon: Dorothy Spark, MD; Location: [place]; Service: Cardiovascular; Laterality: N/A;  . Cardioversion N/A 03/28/2015    Procedure: CARDIOVERSION; Surgeon: Dorothy Spark, MD; Location: [facility] ENDOSCOPY; Service: Cardiovascular; Laterality: N/A;     Current Outpatient Prescriptions  Medication Sig Dispense Refill  . apixaban (ELIQUIS) 5 MG TABS tablet Take 1 tablet (5 mg total) by mouth 2 (two) times daily. 60 tablet 1  . flecainide  (TAMBOCOR) 50 MG tablet Take 1 tablet (50 mg total) by mouth 2 (two) times daily. 60 tablet 3  . metoprolol succinate (TOPROL-XL) 50 MG 24 hr tablet Take 1 tablet (50 mg total) by mouth daily. 90 tablet 3   No current facility-administered medications for this visit.    Allergies: Codeine and Other   Social History: The patient  reports that he has quit smoking. He has never used smokeless tobacco. He reports that he drinks alcohol. He reports that he does not use illicit drugs.   Family History: The patient's family history includes Heart disease in his father and maternal grandfather.    ROS: Please see the history of present illness. All other systems are reviewed and negative.    PHYSICAL EXAM: Filed Vitals:   07/03/15 0539  BP: 124/80  Pulse: 78  Temp: 97.9 F (36.6 C)  Resp: 20   GEN: Well nourished, well developed, in no acute distress  HEENT: normal  Neck: no JVD, carotid bruits, or masses Cardiac: RRR; no murmurs, rubs, or gallops,no edema  Respiratory: clear to auscultation bilaterally, normal work of breathing GI: soft, nontender, nondistended, + BS MS: no deformity or atrophy  Skin: warm and dry  Neuro: Strength and sensation are intact Psych: euthymic mood, full affect    Lipid Panel   Labs (Brief)       Component Value Date/Time   CHOL 144 03/28/2015 0229   TRIG 110 03/28/2015 0229   HDL 29* 03/28/2015 0229   CHOLHDL 5.0 03/28/2015 0229   VLDL 22 03/28/2015 0229   LDLCALC 93 03/28/2015 0229       Wt Readings from Last 3 Encounters:  05/16/15 183 lb (83.008 kg)  05/15/15 181 lb 3.2 oz (82.192 kg)  05/15/15 180 lb (81.647 kg)       ASSESSMENT AND PLAN:  1. Paroxysmal atrial fibrillation The patient continues to have episodic afib despite medical therapy with flecainide and metoprolol. Chads2vasc score is 0. Therapeutic strategies for afib including medicine and ablation were  discussed in detail with the patient today. Risk, benefits, and alternatives to EP study and radiofrequency ablation for afib were also discussed in detail today. These risks include but are not limited to stroke, bleeding, vascular damage, tamponade, perforation, damage to the esophagus, lungs, and other structures, pulmonary vein stenosis, worsening renal function, and death. The patient understands these risk and wishes to proceed at this time.  [name] MD, [facility] 07/03/2015 7:36 AM

## 2015-07-04 DIAGNOSIS — I480 Paroxysmal atrial fibrillation: Secondary | ICD-10-CM | POA: Diagnosis not present

## 2015-07-04 DIAGNOSIS — M1990 Unspecified osteoarthritis, unspecified site: Secondary | ICD-10-CM | POA: Diagnosis not present

## 2015-07-04 DIAGNOSIS — K222 Esophageal obstruction: Secondary | ICD-10-CM | POA: Diagnosis not present

## 2015-07-04 DIAGNOSIS — I4891 Unspecified atrial fibrillation: Secondary | ICD-10-CM | POA: Diagnosis not present

## 2015-07-04 DIAGNOSIS — K219 Gastro-esophageal reflux disease without esophagitis: Secondary | ICD-10-CM | POA: Diagnosis not present

## 2015-07-04 MED ORDER — PANTOPRAZOLE SODIUM 40 MG PO TBEC
40.0000 mg | DELAYED_RELEASE_TABLET | Freq: Every day | ORAL | Status: DC
Start: 2015-07-04 — End: 2015-07-31

## 2015-07-04 NOTE — Progress Notes (Signed)
Pt d/c home per MD order, pt VSS, pt verbalized understanding of d/c, instructions given per MD order, family at [facility], all questions answered

## 2015-07-04 NOTE — Discharge Instructions (Signed)
You have an appointment set up with the Atrial Fibrillation Clinic.  Multiple studies have shown that being followed by a dedicated atrial fibrillation clinic in addition to the standard care you receive from your other physicians improves health. We believe that enrollment in the atrial fibrillation clinic will allow us to better care for you.  ° °The phone number to the Atrial Fibrillation Clinic is [phone]. The clinic is staffed Monday through Friday from 8:30am to 5pm. ° °Parking Directions: The clinic is located in the Heart and Vascular Building connected to [facility]. °1)From [address] turn on to [address] and go to the 3rd entrance  (Heart and Vascular entrance) on the right. °2)Look to the right for Heart &Vascular Parking Garage. °3)A code for the entrance is required please call the clinic to receive this.   °4)Take the elevators to the 1st floor. Registration is in the room with the glass walls at the end of the hallway. ° °If you have any trouble parking or locating the clinic, please don’t hesitate to call [phone]. °Information on my medicine - ELIQUIS® (apixaban) ° °Why was Eliquis® prescribed for you? °Eliquis® was prescribed for you to reduce the risk of a blood clot forming that can cause a stroke if you have a medical condition called atrial fibrillation (a type of irregular heartbeat). ° °What do You need to know about Eliquis® ? °Take your Eliquis® TWICE DAILY - one tablet in the morning and one tablet in the evening with or without food. If you have difficulty swallowing the tablet whole please discuss with your pharmacist how to take the medication safely. ° °Take Eliquis® exactly as prescribed by your doctor and DO NOT stop taking Eliquis® without talking to the doctor who prescribed the medication.  Stopping may increase your risk of developing a stroke.  Refill your prescription before you run out. ° °After discharge, you should have regular check-up  appointments with your healthcare provider that is prescribing your Eliquis®.  In the future your dose may need to be changed if your kidney function or weight changes by a significant amount or as you get older. ° °What do you do if you miss a dose? °If you miss a dose, take it as soon as you remember on the same day and resume taking twice daily.  Do not take more than one dose of ELIQUIS at the same time to make up a missed dose. ° °Important Safety Information °A possible side effect of Eliquis® is bleeding. You should call your healthcare provider right away if you experience any of the following: °? Bleeding from an injury or your nose that does not stop. °? Unusual colored urine (red or dark brown) or unusual colored stools (red or black). °? Unusual bruising for unknown reasons. °? A serious fall or if you hit your head (even if there is no bleeding). ° °Some medicines may interact with Eliquis® and might increase your risk of bleeding or clotting while on Eliquis®. To help avoid this, consult your healthcare provider or pharmacist prior to using any new prescription or non-prescription medications, including herbals, vitamins, non-steroidal anti-inflammatory drugs (NSAIDs) and supplements. ° °This website has more information on Eliquis® (apixaban): http://www.eliquis.com/eliquis/home ° °

## 2015-07-11 ENCOUNTER — Other Ambulatory Visit: Payer: Self-pay | Admitting: Interventional Cardiology

## 2015-07-31 ENCOUNTER — Other Ambulatory Visit: Payer: Self-pay | Admitting: Nurse Practitioner

## 2015-07-31 ENCOUNTER — Ambulatory Visit (HOSPITAL_COMMUNITY)
Admission: RE | Admit: 2015-07-31 | Discharge: 2015-07-31 | Disposition: A | Discharge status: Home / Self Care | Payer: 59 | Source: Ambulatory Visit | Attending: Nurse Practitioner | Admitting: Nurse Practitioner

## 2015-07-31 ENCOUNTER — Encounter (HOSPITAL_COMMUNITY): Payer: Self-pay | Admitting: Nurse Practitioner

## 2015-07-31 VITALS — BP 112/74 | HR 90 | Ht 73.0 in | Wt 183.2 lb

## 2015-07-31 DIAGNOSIS — Z8679 Personal history of other diseases of the circulatory system: Secondary | ICD-10-CM

## 2015-07-31 DIAGNOSIS — Z9889 Other specified postprocedural states: Secondary | ICD-10-CM

## 2015-07-31 DIAGNOSIS — Z7901 Long term (current) use of anticoagulants: Secondary | ICD-10-CM | POA: Diagnosis not present

## 2015-07-31 DIAGNOSIS — Z0189 Encounter for other specified special examinations: Secondary | ICD-10-CM | POA: Insufficient documentation

## 2015-07-31 DIAGNOSIS — K5090 Crohn's disease, unspecified, without complications: Secondary | ICD-10-CM | POA: Insufficient documentation

## 2015-07-31 DIAGNOSIS — Z87891 Personal history of nicotine dependence: Secondary | ICD-10-CM | POA: Diagnosis not present

## 2015-07-31 DIAGNOSIS — I4891 Unspecified atrial fibrillation: Secondary | ICD-10-CM | POA: Diagnosis not present

## 2015-07-31 DIAGNOSIS — M1990 Unspecified osteoarthritis, unspecified site: Secondary | ICD-10-CM | POA: Insufficient documentation

## 2015-07-31 DIAGNOSIS — I480 Paroxysmal atrial fibrillation: Secondary | ICD-10-CM | POA: Diagnosis not present

## 2015-07-31 MED ORDER — METOPROLOL SUCCINATE ER 25 MG PO TB24: 25.0000 mg | ORAL_TABLET | Freq: Every day | ORAL | Status: DC

## 2015-07-31 NOTE — Progress Notes (Signed)
Patient ID: Daniel Orozco, male   DOB: 06-06-1970, 45 y.o.   MRN: [id number]     Primary Care Physician: [name], [place] Referring Physician:   CANTRELL Orozco is a 45 y.o. male with a h/o afib ablation 3/21 for f/u in the afib clinic. He reports that he has not had any afib but does have elevation of heart rate. His flecainide and BB were stopped at time of ablation. He denies any rt leg pain or groin pain. He continues on xarelto. He is currently on colchicine for gout of left ankle.  Today, he denies symptoms of  chest pain, shortness of breath, orthopnea, PND, lower extremity edema, dizziness, presyncope, syncope, or neurologic sequela. The patient is tolerating medications without difficulties and is otherwise without complaint today.   Past Medical History  Diagnosis Date  . Arthritis   . S/P balloon dilatation of esophageal stricture   . Crohn disease ([place])     in remission since 2000  . Paroxysmal atrial fibrillation Daniel Orozco)    Past Surgical History  Procedure Laterality Date  . Fracture surgery    . Esophageal dilation    . Tee without cardioversion N/A 03/28/2015    Procedure: TRANSESOPHAGEAL ECHOCARDIOGRAM (TEE);  Surgeon: Dorothy Spark, MD;  Location: [place];  Service: Cardiovascular;  Laterality: N/A;  . Cardioversion N/A 03/28/2015    Procedure: CARDIOVERSION;  Surgeon: Dorothy Spark, MD;  Location: [place];  Service: Cardiovascular;  Laterality: N/A;  . Electrophysiologic study N/A 07/03/2015    Procedure: Atrial Fibrillation Ablation;  Surgeon: [name], MD;  Location: [place] [facility];  Service: Cardiovascular;  Laterality: N/A;    Current Outpatient Prescriptions  Medication Sig Dispense Refill  . colchicine 0.6 MG tablet Take 1 tablet (0.6 mg total) by mouth daily. At first sign of gout flair, take 2 tabs pox1 and 1 tab 1 hr later. Do not use >3 tabs in 24 hr 60 tablet 1  . ELIQUIS 5 MG TABS tablet TAKE 1 TABLET (5 MG TOTAL) BY MOUTH  2 (TWO) TIMES DAILY. 60 tablet 5  . pantoprazole (PROTONIX) 40 MG tablet Take 1 tablet (40 mg total) by mouth daily. 45 tablet 0  . metoprolol succinate (TOPROL XL) 25 MG 24 hr tablet Take 1 tablet (25 mg total) by mouth daily. 30 tablet 6   No current facility-administered medications for this encounter.    Allergies  Allergen Reactions  . Other Swelling    Honey dew melon causes throat swelling, cantaloupe     Social History   Social History  . Marital Status: Married    Spouse Name: N/A  . Number of Children: 3  . Years of Education: 14   Occupational History  . Mechanic    Social History Main Topics  . Smoking status: Former Smoker -- 1.00 packs/day for 2 years  . Smokeless tobacco: Never Used     Comment: quit in 1999, only smoked for 2 years  . Alcohol Use: No  . Drug Use: No  . Sexual Activity: Yes    Birth Control/ Protection: Other-see comments   Other Topics Concern  . Not on file   Social History Narrative   Fun: Soccer   Works at [employer] as their [employer]    Family History  Problem Relation Age of Onset  . Heart disease Maternal Grandfather   . Heart disease Father     RBBB  . Healthy Maternal Grandmother   . Healthy  Paternal Grandmother   . Healthy Paternal Grandfather   . Healthy Mother     ROS- All systems are reviewed and negative except as per the HPI above  Physical Exam: Filed Vitals:   07/31/15 0952  BP: 112/74  Pulse: 90  Height: 6\' 1"  (1.854 m)  Weight: 183 lb 3.2 oz (83.099 kg)    GEN- The patient is well appearing, alert and oriented x 3 today.   Head- normocephalic, atraumatic Eyes-  Sclera clear, conjunctiva pink Ears- hearing intact Oropharynx- clear Neck- supple, no JVP Lymph- no cervical lymphadenopathy Lungs- Clear to ausculation bilaterally, normal work of breathing Heart- Regular rate and rhythm, no murmurs, rubs or gallops, PMI not laterally displaced GI- soft, NT, ND, +  BS Extremities- no clubbing, cyanosis, or edema MS- no significant deformity or atrophy Skin- no rash or lesion Psych- euthymic mood, full affect Neuro- strength and sensation are intact  EKG-NSR at 90 bpm, pr int 158 ms, qrs 82 ms, Qtc 420 ms Epic records reviewed  Assessment and Plan: 1. afib S/p ablation staying in SR Pt does not feel that he has had afib but does feel that his heart rate is elevated Can try 12.5 mg metoprolol ER daily and increase to 25 mg daily for heart rate more so in 70-80 range. Currently pt reporting HR in the 90's. Continue xarelto but has a chadsvasc score of 0 and will probably be able to stop at end of 3 month period  when seen by Dr. Rayann Heman.  Geroge Baseman Orozco, [place] Orozco [address] [place], [place] [address] [phone]

## 2015-07-31 NOTE — Patient Instructions (Signed)
Your physician has recommended you make the following change in your medication:  12.5mg   (1/2 tablet) of metoprolol daily may increase to 25mg  if needed

## 2015-08-28 ENCOUNTER — Encounter: Payer: Self-pay | Admitting: Family

## 2015-08-28 ENCOUNTER — Ambulatory Visit (INDEPENDENT_AMBULATORY_CARE_PROVIDER_SITE_OTHER): Payer: 59 | Admitting: Family

## 2015-08-28 VITALS — BP 110/78 | HR 95 | Temp 98.0°F | Resp 16 | Ht 72.0 in | Wt 181.0 lb

## 2015-08-28 DIAGNOSIS — M10079 Idiopathic gout, unspecified ankle and foot: Secondary | ICD-10-CM | POA: Diagnosis not present

## 2015-08-28 DIAGNOSIS — M109 Gout, unspecified: Secondary | ICD-10-CM

## 2015-08-28 MED ORDER — ALLOPURINOL 100 MG PO TABS: 100.0000 mg | ORAL_TABLET | Freq: Every day | ORAL | Status: DC

## 2015-08-28 NOTE — Assessment & Plan Note (Signed)
Count remains well-controlled with only one flare since previous office visit and no adverse side effects of medication. Start allopurinol daily for gout prophylaxis. Change colchicine to use for flares only. Follow-up in one month or sooner to determine effectiveness.

## 2015-08-28 NOTE — Patient Instructions (Signed)
Thank you for choosing [facility].  Summary/Instructions:  Please stop taking the colchicine daily and start taking the allopurinol.  For gout flares - Take 2 tablets at the onset and 1 tablet 1 hour later for a total of 1.8 mg in a day.  Your prescription(s) have been submitted to your pharmacy or been printed and provided for you. Please take as directed and contact our office if you believe you are having problem(s) with the medication(s) or have any questions.  If your symptoms worsen or fail to improve, please contact our office for further instruction, or in case of emergency go directly to the emergency room at the closest medical facility.

## 2015-08-28 NOTE — Progress Notes (Signed)
Subjective:    Patient ID: Daniel Orozco, male    DOB: 11-Apr-1971, 45 y.o.   MRN: [id number]  Chief Complaint  Patient presents with  . Follow-up    HPI:  Daniel Orozco is a 45 y.o. male who  has a past medical history of Arthritis; S/P balloon dilatation of esophageal stricture; Crohn disease ([place]); and Paroxysmal atrial fibrillation ([place]). and presents today for a follow up office visit.   1.) Gout - Currently maintained on colchicine. Reports taking the medication as prescribed and denies adverse side effects. Endorses 1 gout flare since previous office visit.  Has reached the 6 month mark of Prophylaxis with colchicine.  Allergies  Allergen Reactions  . Other Swelling    Honey dew melon causes throat swelling, cantaloupe      Current Outpatient Prescriptions on File Prior to Visit  Medication Sig Dispense Refill  . colchicine 0.6 MG tablet Take 1 tablet (0.6 mg total) by mouth daily. At first sign of gout flair, take 2 tabs pox1 and 1 tab 1 hr later. Do not use >3 tabs in 24 hr 60 tablet 1  . ELIQUIS 5 MG TABS tablet TAKE 1 TABLET (5 MG TOTAL) BY MOUTH 2 (TWO) TIMES DAILY. 60 tablet 5  . metoprolol succinate (TOPROL XL) 25 MG 24 hr tablet Take 1 tablet (25 mg total) by mouth daily. 30 tablet 6  . pantoprazole (PROTONIX) 40 MG tablet TAKE 1 TABLET (40 MG TOTAL) BY MOUTH DAILY. 45 tablet 0   No current facility-administered medications on file prior to visit.    Review of Systems  Constitutional: Negative for fever and chills.  Musculoskeletal: Negative for joint swelling, arthralgias and gait problem.  Neurological: Negative for weakness and numbness.      Objective:    BP 110/78 mmHg  Pulse 95  Temp(Src) 98 F (36.7 C) (Oral)  Resp 16  Ht 6' (1.829 m)  Wt 181 lb (82.101 kg)  BMI 24.54 kg/m2  SpO2 97% Nursing note and vital signs reviewed.  Physical Exam  Constitutional: He is oriented to person, place, and time. He appears well-developed and  well-nourished. No distress.  Cardiovascular: Normal rate, regular rhythm, normal heart sounds and intact distal pulses.   Pulmonary/Chest: Effort normal and breath sounds normal.  Neurological: He is alert and oriented to person, place, and time.  Skin: Skin is warm and dry.  Psychiatric: He has a normal mood and affect. His behavior is normal. Judgment and thought content normal.       Assessment & Plan:   Problem List Items Addressed This Visit      Other   Gout - Primary    Count remains well-controlled with only one flare since previous office visit and no adverse side effects of medication. Start allopurinol daily for gout prophylaxis. Change colchicine to use for flares only. Follow-up in one month or sooner to determine effectiveness.      Relevant Medications   allopurinol (ZYLOPRIM) 100 MG tablet       I am having Mr. Anchondo start on allopurinol. I am also having him maintain his colchicine, ELIQUIS, pantoprazole, and metoprolol succinate.   Meds ordered this encounter  Medications  . allopurinol (ZYLOPRIM) 100 MG tablet    Sig: Take 1 tablet (100 mg total) by mouth daily.    Dispense:  30 tablet    Refill:  1    Order Specific Question:  Supervising Provider    Answer:  Pricilla Holm A [id number]  Follow-up: Return in about 1 month (around 09/28/2015), or if symptoms worsen or fail to improve.  [name], FNP

## 2015-08-28 NOTE — Assessment & Plan Note (Signed)
>>  ASSESSMENT AND PLAN FOR GOUT WRITTEN ON 08/28/2015  3:33 PM BY CALONE, GREGORY D, FNP  Count remains well-controlled with only one flare since previous office visit and no adverse side effects of medication. Start allopurinol daily for gout prophylaxis. Change colchicine to use for flares only. Follow-up in one month or sooner to determine effectiveness.

## 2015-08-28 NOTE — Progress Notes (Signed)
Pre visit review using our clinic review tool, if applicable. No additional management support is needed unless otherwise documented below in the visit note. 

## 2015-09-20 ENCOUNTER — Other Ambulatory Visit: Payer: Self-pay | Admitting: Family

## 2015-10-03 ENCOUNTER — Ambulatory Visit (INDEPENDENT_AMBULATORY_CARE_PROVIDER_SITE_OTHER): Payer: 59 | Admitting: Internal Medicine

## 2015-10-03 ENCOUNTER — Encounter: Payer: Self-pay | Admitting: *Deleted

## 2015-10-03 ENCOUNTER — Encounter: Payer: Self-pay | Admitting: Internal Medicine

## 2015-10-03 VITALS — BP 118/78 | HR 81 | Ht 73.0 in | Wt 180.2 lb

## 2015-10-03 DIAGNOSIS — I480 Paroxysmal atrial fibrillation: Secondary | ICD-10-CM | POA: Diagnosis not present

## 2015-10-03 NOTE — Patient Instructions (Addendum)
Medication Instructions:  Your physician has recommended you make the following change in your medication:  1) STOP Eliquis   Labwork: None ordered   Testing/Procedures: None ordered   Follow-Up: Your physician wants you to follow-up in: 3 months with Dr. Allred     Any Other Special Instructions Will Be Listed Below (If Applicable).     If you need a refill on your cardiac medications before your next appointment, please call your pharmacy.   

## 2015-10-03 NOTE — Progress Notes (Signed)
PCP: Daniel Po, FNP Primary Cardiologist:  [name] is a 45 y.o. male who presents today for routine electrophysiology followup.  Since his recent afib ablation, the patient reports doing very well.  He denies procedure related complications and is pleased with results.  Today, he denies symptoms of palpitations, chest pain, shortness of breath,  lower extremity edema, dizziness, presyncope, or syncope.  The patient is otherwise without complaint today.   Past Medical History  Diagnosis Date  . Arthritis   . S/P balloon dilatation of esophageal stricture   . Crohn disease ([place])     in remission since 2000  . Paroxysmal atrial fibrillation [facility])    Past Surgical History  Procedure Laterality Date  . Fracture surgery    . Esophageal dilation    . Tee without cardioversion N/A 03/28/2015    Procedure: TRANSESOPHAGEAL ECHOCARDIOGRAM (TEE);  Surgeon: Dorothy Spark, MD;  Location: [place];  Service: Cardiovascular;  Laterality: N/A;  . Cardioversion N/A 03/28/2015    Procedure: CARDIOVERSION;  Surgeon: Dorothy Spark, MD;  Location: [facility] ENDOSCOPY;  Service: Cardiovascular;  Laterality: N/A;  . Electrophysiologic study N/A 07/03/2015    atrial fibrillation ablation by Dr Rayann Heman    ROS- all systems are reviewed and negatives except as per HPI above  Current Outpatient Prescriptions  Medication Sig Dispense Refill  . allopurinol (ZYLOPRIM) 100 MG tablet Take 1 tablet (100 mg total) by mouth daily. 30 tablet 1  . ELIQUIS 5 MG TABS tablet TAKE 1 TABLET (5 MG TOTAL) BY MOUTH 2 (TWO) TIMES DAILY. 60 tablet 5  . metoprolol succinate (TOPROL XL) 25 MG 24 hr tablet Take 1 tablet (25 mg total) by mouth daily. 30 tablet 6   No current facility-administered medications for this visit.    Physical Exam: Filed Vitals:   10/03/15 0951  BP: 118/78  Pulse: 81  Height: 6\' 1"  (1.854 m)  Weight: 180 lb 3.2 oz (81.738 kg)    GEN- The patient is well appearing, alert  and oriented x 3 today.   Head- normocephalic, atraumatic Eyes-  Sclera clear, conjunctiva pink Ears- hearing intact Oropharynx- clear Lungs- Clear to ausculation bilaterally, normal work of breathing Heart- Regular rate and rhythm, no murmurs, rubs or gallops, PMI not laterally displaced GI- soft, NT, ND, + BS Extremities- no clubbing, cyanosis, or edema  ekg today reveals sinus rhythm 81 bpm, LAD, otherwise normal ekg  Assessment and Plan:  1. 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

## 2015-10-08 ENCOUNTER — Encounter: Payer: Self-pay | Admitting: Family

## 2015-10-08 DIAGNOSIS — M109 Gout, unspecified: Secondary | ICD-10-CM

## 2015-10-08 MED ORDER — INDOMETHACIN 20 MG PO CAPS: 25.0000 mg | ORAL_CAPSULE | Freq: Three times a day (TID) | ORAL | Status: DC | PRN

## 2015-10-08 MED ORDER — COLCHICINE 0.6 MG PO TABS: 0.6000 mg | ORAL_TABLET | Freq: Every day | ORAL | Status: DC

## 2015-10-08 NOTE — Telephone Encounter (Signed)
Patient called about this email he sent. Just following up on it. Thank you.

## 2015-10-08 NOTE — Telephone Encounter (Signed)
Medication has been sent to pharmacy.  °

## 2015-10-11 ENCOUNTER — Encounter: Payer: Self-pay | Admitting: Family

## 2015-10-11 ENCOUNTER — Other Ambulatory Visit: Payer: Self-pay | Admitting: Family

## 2015-10-11 MED ORDER — PREDNISONE 10 MG (21) PO TBPK: ORAL_TABLET | ORAL | Status: DC

## 2015-11-07 ENCOUNTER — Other Ambulatory Visit: Payer: Self-pay | Admitting: Family

## 2015-11-07 DIAGNOSIS — M109 Gout, unspecified: Secondary | ICD-10-CM

## 2015-12-18 ENCOUNTER — Encounter: Payer: Self-pay | Admitting: Internal Medicine

## 2015-12-19 ENCOUNTER — Encounter (HOSPITAL_COMMUNITY): Payer: Self-pay | Admitting: Nurse Practitioner

## 2015-12-19 ENCOUNTER — Other Ambulatory Visit (HOSPITAL_COMMUNITY): Payer: Self-pay | Admitting: *Deleted

## 2015-12-19 MED ORDER — METOPROLOL SUCCINATE ER 25 MG PO TB24: 25.0000 mg | ORAL_TABLET | Freq: Every day | ORAL | 6 refills | Status: DC

## 2015-12-24 ENCOUNTER — Encounter: Payer: Self-pay | Admitting: *Deleted

## 2016-01-07 ENCOUNTER — Encounter: Payer: Self-pay | Admitting: Internal Medicine

## 2016-01-07 ENCOUNTER — Ambulatory Visit (INDEPENDENT_AMBULATORY_CARE_PROVIDER_SITE_OTHER): Payer: 59 | Admitting: Internal Medicine

## 2016-01-07 VITALS — BP 108/82 | HR 94 | Ht 73.0 in | Wt 174.8 lb

## 2016-01-07 DIAGNOSIS — R002 Palpitations: Secondary | ICD-10-CM

## 2016-01-07 DIAGNOSIS — I480 Paroxysmal atrial fibrillation: Secondary | ICD-10-CM

## 2016-01-07 LAB — CBC WITH DIFFERENTIAL/PLATELET
BASOS PCT: 1 %
Basophils Absolute: 66 cells/uL (ref 0–200)
EOS ABS: 528 {cells}/uL — AB (ref 15–500)
Eosinophils Relative: 8 %
HEMATOCRIT: 43.5 % (ref 38.5–50.0)
Hemoglobin: 15.5 g/dL (ref 13.2–17.1)
LYMPHS PCT: 28 %
Lymphs Abs: 1848 cells/uL (ref 850–3900)
MCH: 30 pg (ref 27.0–33.0)
MCHC: 35.6 g/dL (ref 32.0–36.0)
MCV: 84.1 fL (ref 80.0–100.0)
MONO ABS: 660 {cells}/uL (ref 200–950)
MPV: 10.1 fL (ref 7.5–12.5)
Monocytes Relative: 10 %
NEUTROS ABS: 3498 {cells}/uL (ref 1500–7800)
NEUTROS PCT: 53 %
Platelets: 275 10*3/uL (ref 140–400)
RBC: 5.17 MIL/uL (ref 4.20–5.80)
RDW: 12.3 % (ref 11.0–15.0)
WBC: 6.6 10*3/uL (ref 3.8–10.8)

## 2016-01-07 LAB — BASIC METABOLIC PANEL
BUN: 13 mg/dL (ref 7–25)
CHLORIDE: 104 mmol/L (ref 98–110)
CO2: 28 mmol/L (ref 20–31)
CREATININE: 1.28 mg/dL (ref 0.60–1.35)
Calcium: 9.5 mg/dL (ref 8.6–10.3)
Glucose, Bld: 76 mg/dL (ref 65–99)
POTASSIUM: 4.1 mmol/L (ref 3.5–5.3)
SODIUM: 140 mmol/L (ref 135–146)

## 2016-01-07 MED ORDER — METOPROLOL SUCCINATE ER 25 MG PO TB24: 37.5000 mg | ORAL_TABLET | Freq: Every day | ORAL | 3 refills | Status: DC

## 2016-01-07 NOTE — Patient Instructions (Addendum)
Medication Instructions:  Your physician recommends that you continue on your current medications as directed. Please refer to the Current Medication list given to you today.   Labwork: Your physician recommends that you return for lab work today: TSH/T4/BMP/CBC   Testing/Procedures: Your physician has requested that you have an echocardiogram. Echocardiography is a painless test that uses sound waves to create images of your heart. It provides your doctor with information about the size and shape of your heart and how well your heart's chambers and valves are working. This procedure takes approximately one hour. There are no restrictions for this procedure.    Follow-Up: Your physician recommends that you schedule a follow-up appointment in: 2 months with Dr Rayann Heman   Any Other Special Instructions Will Be Listed Below (If Applicable).    Call if you decide to proceed with [facility] implant

## 2016-01-07 NOTE — Progress Notes (Signed)
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  Since his recent afib ablation, the patient reports doing very well.  He has occasional palpitations lasting 1-2 minutes with chest tightness.  He thinks this feels "much different" from afib.  He denies assocaited dizziness or SOB.  Today, he denies symptoms of palpitations, chest pain,   lower extremity edema, presyncope, or syncope.  The patient is otherwise without complaint today.   Past Medical History:  Diagnosis Date  . Arthritis   . Crohn disease ([place])    in remission since 2000  . Paroxysmal atrial fibrillation (HCC)   . S/P balloon dilatation of esophageal stricture    Past Surgical History:  Procedure Laterality Date  . CARDIOVERSION N/A 03/28/2015   Procedure: CARDIOVERSION;  Surgeon: Dorothy Spark, MD;  Location: [facility] ENDOSCOPY;  Service: Cardiovascular;  Laterality: N/A;  . ELECTROPHYSIOLOGIC STUDY N/A 07/03/2015   atrial fibrillation ablation by Dr Rayann Heman  . ESOPHAGEAL DILATION    . FRACTURE SURGERY    . TEE WITHOUT CARDIOVERSION N/A 03/28/2015   Procedure: TRANSESOPHAGEAL ECHOCARDIOGRAM (TEE);  Surgeon: Dorothy Spark, MD;  Location: [facility] ENDOSCOPY;  Service: Cardiovascular;  Laterality: N/A;    ROS- all systems are reviewed and negatives except as per HPI above  Current Outpatient Prescriptions  Medication Sig Dispense Refill  . colchicine 0.6 MG tablet Take 1 tablet (0.6 mg total) by mouth daily. 3 tablet 1  . metoprolol succinate (TOPROL-XL) 25 MG 24 hr tablet Take 37.5 mg by mouth daily.     No current facility-administered medications for this visit.     Physical Exam: Vitals:   01/07/16 1414  BP: 108/82  Pulse: 94  Weight: 174 lb 12.8 oz (79.3 kg)  Height: 6\' 1"  (1.854 m)    GEN- The patient is well appearing, alert and oriented x 3 today.   Head- normocephalic, atraumatic Eyes-  Sclera clear,  conjunctiva pink Ears- hearing intact Oropharynx- clear Lungs- Clear to ausculation bilaterally, normal work of breathing Heart- Regular rate and rhythm, no murmurs, rubs or gallops, PMI not laterally displaced GI- soft, NT, ND, + BS Extremities- no clubbing, cyanosis, or edema  ekg today reveals sinus rhythm 94 bpm, LAD, otherwise normal ekg  Assessment and Plan:  1. afib Maintaining sinus rhythm off AADs post ablation.  He has rare palpitations of unclear etiology. I think that we should better characterize these.  Today, we discussed AliveCor device/app vs implantable loop recorder implantation.  He will contemplate options and contact my office if he decides to proceed with ILR placement. Stop eliquis (chads2vasc score is 0) No changes today. Check tfts, bmet, cbc, and echo for other causes of tachypalpitations  Return in 2 months  [name] MD, [facility] 01/07/2016 2:38 PM

## 2016-01-08 LAB — TSH: TSH: 0.98 m[IU]/L (ref 0.40–4.50)

## 2016-01-08 LAB — T4, FREE: Free T4: 1 ng/dL (ref 0.8–1.8)

## 2016-01-09 ENCOUNTER — Other Ambulatory Visit: Payer: Self-pay | Admitting: Family

## 2016-01-09 DIAGNOSIS — M109 Gout, unspecified: Secondary | ICD-10-CM

## 2016-01-21 ENCOUNTER — Ambulatory Visit (HOSPITAL_COMMUNITY): Payer: 59 | Attending: Cardiovascular Disease

## 2016-01-21 ENCOUNTER — Other Ambulatory Visit: Payer: Self-pay

## 2016-01-21 DIAGNOSIS — I340 Nonrheumatic mitral (valve) insufficiency: Secondary | ICD-10-CM | POA: Diagnosis not present

## 2016-01-21 DIAGNOSIS — I361 Nonrheumatic tricuspid (valve) insufficiency: Secondary | ICD-10-CM | POA: Diagnosis not present

## 2016-01-21 DIAGNOSIS — I501 Left ventricular failure: Secondary | ICD-10-CM | POA: Diagnosis not present

## 2016-01-21 DIAGNOSIS — R002 Palpitations: Secondary | ICD-10-CM | POA: Diagnosis not present

## 2016-01-21 DIAGNOSIS — I517 Cardiomegaly: Secondary | ICD-10-CM | POA: Diagnosis not present

## 2016-01-21 LAB — ECHOCARDIOGRAM COMPLETE
CHL CUP DOP CALC LVOT VTI: 19.2 cm
CHL CUP TV REG PEAK VELOCITY: 113 cm/s
E decel time: 102 msec
EERAT: 7.25
FS: 23 % — AB (ref 28–44)
IVS/LV PW RATIO, ED: 0.92
LA diam end sys: 35 mm
LA vol index: 21.8 mL/m2
LADIAMINDEX: 1.73 cm/m2
LASIZE: 35 mm
LAVOL: 44 mL
LAVOLA4C: 40 mL
LDCA: 3.46 cm2
LV E/e'average: 7.25
LVEEMED: 7.25
LVELAT: 8.44 cm/s
LVOT SV: 66 mL
LVOT diameter: 21 mm
LVOT peak vel: 86.5 cm/s
Lateral S' vel: 12.4 cm/s
MV Dec: 102
MV pk E vel: 61.2 m/s
MVPKAVEL: 74 m/s
PW: 12 mm — AB (ref 0.6–1.1)
Peak grad: 113 mmHg
RV sys press: 8 mmHg
TDI e' lateral: 8.44
TDI e' medial: 8.01
TRMAXVEL: 113 cm/s

## 2016-03-01 ENCOUNTER — Other Ambulatory Visit: Payer: Self-pay | Admitting: Family

## 2016-03-01 DIAGNOSIS — M109 Gout, unspecified: Secondary | ICD-10-CM

## 2016-03-10 ENCOUNTER — Ambulatory Visit (INDEPENDENT_AMBULATORY_CARE_PROVIDER_SITE_OTHER): Payer: 59 | Admitting: Internal Medicine

## 2016-03-10 ENCOUNTER — Encounter: Payer: Self-pay | Admitting: Internal Medicine

## 2016-03-10 VITALS — BP 104/80 | HR 87 | Ht 72.0 in | Wt 176.4 lb

## 2016-03-10 DIAGNOSIS — I480 Paroxysmal atrial fibrillation: Secondary | ICD-10-CM

## 2016-03-10 MED ORDER — METOPROLOL SUCCINATE ER 25 MG PO TB24: 37.5000 mg | ORAL_TABLET | Freq: Every day | ORAL | 3 refills | Status: DC

## 2016-03-10 NOTE — Patient Instructions (Signed)

## 2016-03-10 NOTE — Progress Notes (Signed)
   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  Since his recent afib ablation, the patient reports doing very well.  He has had no afib since his last visit.  Today, he denies symptoms of palpitations, chest pain,   lower extremity edema, presyncope, or syncope.  The patient is otherwise without complaint today.   Past Medical History:  Diagnosis Date  . Arthritis   . Crohn disease ([place])    in remission since 2000  . Paroxysmal atrial fibrillation (HCC)   . S/P balloon dilatation of esophageal stricture    Past Surgical History:  Procedure Laterality Date  . CARDIOVERSION N/A 03/28/2015   Procedure: CARDIOVERSION;  Surgeon: Dorothy Spark, MD;  Location: [facility] ENDOSCOPY;  Service: Cardiovascular;  Laterality: N/A;  . ELECTROPHYSIOLOGIC STUDY N/A 07/03/2015   atrial fibrillation ablation by Dr Rayann Heman  . ESOPHAGEAL DILATION    . FRACTURE SURGERY    . TEE WITHOUT CARDIOVERSION N/A 03/28/2015   Procedure: TRANSESOPHAGEAL ECHOCARDIOGRAM (TEE);  Surgeon: Dorothy Spark, MD;  Location: [facility] ENDOSCOPY;  Service: Cardiovascular;  Laterality: N/A;    ROS- all systems are reviewed and negatives except as per HPI above  Current Outpatient Prescriptions  Medication Sig Dispense Refill  . Colchicine (MITIGARE) 0.6 MG CAPS Take 1 capsule by mouth daily.    . metoprolol succinate (TOPROL-XL) 25 MG 24 hr tablet Take 1.5 tablets (37.5 mg total) by mouth daily. 135 tablet 3   No current facility-administered medications for this visit.     Physical Exam: Vitals:   03/10/16 1404  BP: 104/80  Pulse: 87  Weight: 176 lb 6.4 oz (80 kg)  Height: 6' (1.829 m)    GEN- The patient is well appearing, alert and oriented x 3 today.   Head- normocephalic, atraumatic Eyes-  Sclera clear, conjunctiva pink Ears- hearing intact Oropharynx- clear Lungs- Clear to ausculation bilaterally, normal  work of breathing Heart- Regular rate and rhythm, no murmurs, rubs or gallops, PMI not laterally displaced GI- soft, NT, ND, + BS Extremities- no clubbing, cyanosis, or edema  ekg today reveals sinus rhythm 87 bpm, LAD, otherwise normal ekg  Assessment and Plan:  1. afib Maintaining sinus rhythm off AADs post ablation.  He has rare palpitations of unclear etiology.  Reluctant to stop metoprolol.  He is considering purchase of AliveCor to better characterize these episodes.  Stop eliquis (chads2vasc score is 0) No changes today.  Return in 6 months  [name] MD, [facility] 03/10/2016 2:24 PM

## 2016-07-26 ENCOUNTER — Other Ambulatory Visit: Payer: Self-pay | Admitting: Family

## 2016-07-26 DIAGNOSIS — M109 Gout, unspecified: Secondary | ICD-10-CM

## 2016-10-25 ENCOUNTER — Other Ambulatory Visit: Payer: Self-pay | Admitting: Family

## 2016-10-25 DIAGNOSIS — M109 Gout, unspecified: Secondary | ICD-10-CM

## 2016-11-16 ENCOUNTER — Other Ambulatory Visit: Payer: Self-pay | Admitting: Family

## 2016-11-16 DIAGNOSIS — M109 Gout, unspecified: Secondary | ICD-10-CM

## 2016-11-17 ENCOUNTER — Encounter: Payer: Self-pay | Admitting: Family

## 2016-11-17 ENCOUNTER — Encounter: Payer: Self-pay | Admitting: Internal Medicine

## 2016-11-19 ENCOUNTER — Encounter: Payer: Self-pay | Admitting: Family

## 2016-11-19 MED ORDER — COLCHICINE 0.6 MG PO CAPS: 1.0000 | ORAL_CAPSULE | Freq: Every day | ORAL | 0 refills | Status: DC

## 2016-11-21 ENCOUNTER — Encounter: Payer: Self-pay | Admitting: Family

## 2016-11-21 ENCOUNTER — Ambulatory Visit (INDEPENDENT_AMBULATORY_CARE_PROVIDER_SITE_OTHER): Payer: 59 | Admitting: Family

## 2016-11-21 VITALS — BP 120/82 | HR 86 | Temp 98.6°F | Resp 16 | Ht 72.0 in | Wt 185.0 lb

## 2016-11-21 DIAGNOSIS — M109 Gout, unspecified: Secondary | ICD-10-CM

## 2016-11-21 DIAGNOSIS — Z0000 Encounter for general adult medical examination without abnormal findings: Secondary | ICD-10-CM | POA: Insufficient documentation

## 2016-11-21 MED ORDER — ALLOPURINOL 100 MG PO TABS: 100.0000 mg | ORAL_TABLET | Freq: Every day | ORAL | 1 refills | Status: DC

## 2016-11-21 NOTE — Assessment & Plan Note (Signed)
1) Anticipatory Guidance: Discussed importance of wearing a seatbelt while driving and not texting while driving; changing batteries in smoke detector at least once annually; wearing suntan lotion when outside; eating a balanced and moderate diet; getting physical activity at least 30 minutes per day.  2) Immunizations / Screenings / Labs:  Declines tetanus. All other immunizations are up-to-date per recommendations. Recommend follow-up with gastroenterology for colonoscopy secondary to Crohn's disease. All other screenings are up-to-date per recommendations. Obtain CBC, CMET, and lipid profile.    Overall well exam with risk factors for cardiovascular disease being minimal. He is of good weight and exercises regularly. No evidence of atrial fibrillation. Continue healthy lifestyle behaviors and choices. Follow-up prevention exam in 1 year. Follow-up office visit pending blood work and for chronic conditions as needed.

## 2016-11-21 NOTE — Assessment & Plan Note (Signed)
Stable with no current flares. Discontinue Mitigare secondary to cost. Start allopurinol. Continue to monitor.

## 2016-11-21 NOTE — Assessment & Plan Note (Signed)
>>  ASSESSMENT AND PLAN FOR GOUT WRITTEN ON 11/21/2016  4:07 PM BY CALONE, GREGORY D, FNP  Stable with no current flares. Discontinue Mitigare secondary to cost. Start allopurinol. Continue to monitor.

## 2016-11-21 NOTE — Progress Notes (Signed)
Subjective:    Patient ID: Daniel Orozco, male    DOB: June 15, 1970, 46 y.o.   MRN: [id number]  Chief Complaint  Patient presents with  . CPE    not fasting, would like another medication for gout sent in     HPI:  Daniel Orozco is a 46 y.o. male who presents today for an annual wellness visit.   1) Health Maintenance -   Diet - Averages about 2-3 meals per day consisting of a regular diet; No caffeine intake  Exercise - Daily; Play golf and runs between shots    2) Preventative Exams / Immunizations:  Dental -- Up to date   Vision -- Up to date   Health Maintenance  Topic Date Due  . HIV Screening  05/01/1984  . INFLUENZA VACCINE  11/12/2016  . TETANUS/TDAP  11/21/2017 (Originally 05/01/1988)    There is no immunization history for the selected administration types on file for this patient.   Allergies  Allergen Reactions  . Other Swelling    Honey dew melon and cantaloupe causes throat swelling      Outpatient Medications Prior to Visit  Medication Sig Dispense Refill  . metoprolol succinate (TOPROL-XL) 25 MG 24 hr tablet Take 1.5 tablets (37.5 mg total) by mouth daily. 135 tablet 3  . Colchicine (MITIGARE) 0.6 MG CAPS Take 1 capsule by mouth daily. Must keep appt for future refills 30 capsule 0  . MITIGARE 0.6 MG CAPS TAKE 1 CAPSULE EVERY DAY 30 capsule 1   No facility-administered medications prior to visit.      Past Medical History:  Diagnosis Date  . Arthritis   . Crohn disease ([place])    in remission since 2000  . Paroxysmal atrial fibrillation (HCC)   . S/P balloon dilatation of esophageal stricture      Past Surgical History:  Procedure Laterality Date  . ABLATION    . CARDIOVERSION N/A 03/28/2015   Procedure: CARDIOVERSION;  Surgeon: Dorothy Spark, MD;  Location: [facility] ENDOSCOPY;  Service: Cardiovascular;  Laterality: N/A;  . ELECTROPHYSIOLOGIC STUDY N/A 07/03/2015   atrial fibrillation ablation by Dr Rayann Heman  . ESOPHAGEAL DILATION     . FRACTURE SURGERY    . TEE WITHOUT CARDIOVERSION N/A 03/28/2015   Procedure: TRANSESOPHAGEAL ECHOCARDIOGRAM (TEE);  Surgeon: Dorothy Spark, MD;  Location: [facility] ENDOSCOPY;  Service: Cardiovascular;  Laterality: N/A;     Family History  Problem Relation Age of Onset  . Heart disease Maternal Grandfather   . Heart disease Father        RBBB  . Healthy Maternal Grandmother   . Healthy Paternal Grandmother   . Healthy Paternal Grandfather   . Healthy Mother      Social History   Social History  . Marital status: Married    Spouse name: N/A  . Number of children: 3  . Years of education: 14   Occupational History  . Manager    Social History Main Topics  . Smoking status: Former Smoker    Packs/day: 1.00    Years: 2.00  . Smokeless tobacco: Never Used     Comment: quit in 1999, only smoked for 2 years  . Alcohol use 0.0 oz/week     Comment: Occasionally  . Drug use: No  . Sexual activity: Yes    Birth control/ protection: Other-see comments   Other Topics Concern  . 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 HENT: Head: Denies headache or neck pain Ears: Denies changes in hearing, ringing in ears, earache, drainage Nose: Denies discharge, stuffiness, itching, nosebleed, sinus pain Throat: Denies sore throat, hoarseness, dry mouth, sores, thrush Eyes: Denies loss/changes in vision, pain, redness, blurry/double vision, flashing lights Cardiovascular: Denies chest pain/discomfort, tightness, palpitations, shortness of breath with activity, difficulty lying down, swelling, sudden awakening with shortness of breath Respiratory: Denies shortness of breath, cough, sputum production, wheezing Gastrointestinal: Denies dysphasia, heartburn, change in appetite, nausea, change in bowel  habits, rectal bleeding, constipation, diarrhea, yellow skin or eyes Genitourinary: Denies frequency, urgency, burning/pain, blood in urine, incontinence, change in urinary strength. Musculoskeletal: Denies muscle/joint pain, stiffness, back pain, redness or swelling of joints, trauma Skin: Denies rashes, lumps, itching, dryness, color changes, or hair/nail changes Neurological: Denies dizziness, fainting, seizures, weakness, numbness, tingling, tremor Psychiatric - Denies nervousness, stress, depression or memory loss Endocrine: Denies heat or cold intolerance, sweating, frequent urination, excessive thirst, changes in appetite Hematologic: Denies ease of bruising or bleeding     Objective:     BP 120/82 (BP Location: Left Arm, Patient Position: Sitting, Cuff Size: Normal)   Pulse 86   Temp 98.6 F (37 C) (Oral)   Resp 16   Ht 6' (1.829 m)   Wt 185 lb (83.9 kg)   SpO2 98%   BMI 25.09 kg/m  Nursing note and vital signs reviewed.  Physical Exam  Constitutional: He is oriented to person, place, and time. He appears well-developed and well-nourished.  HENT:  Head: Normocephalic.  Right Ear: Hearing, tympanic membrane, external ear and ear canal normal.  Left Ear: Hearing, tympanic membrane, external ear and ear canal normal.  Nose: Nose normal.  Mouth/Throat: Uvula is midline, oropharynx is clear and moist and mucous membranes are normal.  Eyes: Pupils are equal, round, and reactive to light. Conjunctivae and EOM are normal.  Neck: Neck supple. No JVD present. No tracheal deviation present. No thyromegaly present.  Cardiovascular: Normal rate, regular rhythm, normal heart sounds and intact distal pulses.   Pulmonary/Chest: Effort normal and breath sounds normal.  Abdominal: Soft. Bowel sounds are normal. He exhibits no distension and no mass. There is no tenderness. There is no rebound and no guarding.  Musculoskeletal: Normal range of motion. He exhibits no edema or tenderness.    Lymphadenopathy:    He has no cervical adenopathy.  Neurological: He is alert and oriented to person, place, and time. He has normal reflexes. No cranial nerve deficit. He exhibits normal muscle tone. Coordination normal.  Skin: Skin is warm and dry.  Psychiatric: He has a normal mood and affect. His behavior is normal. Judgment and thought content normal.       Assessment & Plan:   Problem List Items Addressed This Visit      Other   Gout    Stable with no current flares. Discontinue Mitigare secondary to cost. Start allopurinol. Continue to monitor.      Routine adult health maintenance - Primary    1) Anticipatory Guidance: Discussed importance of wearing a seatbelt while driving and not texting while driving; changing batteries in smoke detector at least once annually; wearing suntan lotion when outside; eating a balanced and moderate diet; getting physical activity at least 30 minutes per day.  2) Immunizations / Screenings / Labs:  Declines tetanus. All other immunizations are up-to-date per  recommendations. Recommend follow-up with gastroenterology for colonoscopy secondary to Crohn's disease. All other screenings are up-to-date per recommendations. Obtain CBC, CMET, and lipid profile.    Overall well exam with risk factors for cardiovascular disease being minimal. He is of good weight and exercises regularly. No evidence of atrial fibrillation. Continue healthy lifestyle behaviors and choices. Follow-up prevention exam in 1 year. Follow-up office visit pending blood work and for chronic conditions as needed.      Relevant Orders   CBC   Comprehensive metabolic panel   Lipid panel       I have discontinued Mr. Holzman's MITIGARE and Colchicine. I am also having him start on allopurinol. Additionally, I am having him maintain his metoprolol succinate.   Meds ordered this encounter  Medications  . allopurinol (ZYLOPRIM) 100 MG tablet    Sig: Take 1 tablet (100 mg total)  by mouth daily.    Dispense:  90 tablet    Refill:  1    Order Specific Question:   Supervising Provider    Answer:   Pricilla Holm A [[id number]]     Follow-up: Return in about 1 year (around 11/21/2017), or if symptoms worsen or fail to improve.   [name], FNP

## 2016-11-21 NOTE — Patient Instructions (Signed)
Thank you for choosing [facility].  SUMMARY AND INSTRUCTIONS:  Please continue to take your medications.   Start the allopurinol.  Blood work when fasting.   Medication:  Your prescription(s) have been submitted to your pharmacy or been printed and provided for you. Please take as directed and contact our office if you believe you are having problem(s) with the medication(s) or have any questions.  Labs:  Please stop by the lab on the lower level of the building for your blood work. Your results will be released to [place] (or called to you) after review, usually within 72 hours after test completion. If any changes need to be made, you will be notified at that same time.  1.) The lab is open from 7:30am to 5:30 pm Monday-Friday 2.) No appointment is necessary 3.) Fasting (if needed) is 6-8 hours after food and drink; black coffee and water are okay   Follow up:  If your symptoms worsen or fail to improve, please contact our office for further instruction, or in case of emergency go directly to the emergency room at the closest medical facility.    Health Maintenance, Male A healthy lifestyle and preventive care is important for your health and wellness. Ask your health care provider about what schedule of regular examinations is right for you. What should I know about weight and diet? Eat a Healthy Diet  Eat plenty of vegetables, fruits, whole grains, low-fat dairy products, and lean protein.  Do not eat a lot of foods high in solid fats, added sugars, or salt.  Maintain a Healthy Weight Regular exercise can help you achieve or maintain a healthy weight. You should:  Do at least 150 minutes of exercise each week. The exercise should increase your heart rate and make you sweat (moderate-intensity exercise).  Do strength-training exercises at least twice a week.  Watch Your Levels of Cholesterol and Blood Lipids  Have your blood tested for lipids and cholesterol  every 5 years starting at 47 years of age. If you are at high risk for heart disease, you should start having your blood tested when you are 47 years old. You may need to have your cholesterol levels checked more often if: ? Your lipid or cholesterol levels are high. ? You are older than 47 years of age. ? You are at high risk for heart disease.  What should I know about cancer screening? Many types of cancers can be detected early and may often be prevented. Lung Cancer  You should be screened every year for lung cancer if: ? You are a current smoker who has smoked for at least 30 years. ? You are a former smoker who has quit within the past 15 years.  Talk to your health care provider about your screening options, when you should start screening, and how often you should be screened.  Colorectal Cancer  Routine colorectal cancer screening usually begins at 47 years of age and should be repeated every 5-10 years until you are 47 years old. You may need to be screened more often if early forms of precancerous polyps or small growths are found. Your health care provider may recommend screening at an earlier age if you have risk factors for colon cancer.  Your health care provider may recommend using home test kits to check for hidden blood in the stool.  A small camera at the end of a tube can be used to examine your colon (sigmoidoscopy or colonoscopy). This checks for the  earliest forms of colorectal cancer.  Prostate and Testicular Cancer  Depending on your age and overall health, your health care provider may do certain tests to screen for prostate and testicular cancer.  Talk to your health care provider about any symptoms or concerns you have about testicular or prostate cancer.  Skin Cancer  Check your skin from head to toe regularly.  Tell your health care provider about any new moles or changes in moles, especially if: ? There is a change in a mole's size, shape, or  color. ? You have a mole that is larger than a pencil eraser.  Always use sunscreen. Apply sunscreen liberally and repeat throughout the day.  Protect yourself by wearing long sleeves, pants, a wide-brimmed hat, and sunglasses when outside.  What should I know about heart disease, diabetes, and high blood pressure?  If you are 36-50 years of age, have your blood pressure checked every 3-5 years. If you are 25 years of age or older, have your blood pressure checked every year. You should have your blood pressure measured twice-once when you are at a hospital or clinic, and once when you are not at a hospital or clinic. Record the average of the two measurements. To check your blood pressure when you are not at a hospital or clinic, you can use: ? An automated blood pressure machine at a pharmacy. ? A home blood pressure monitor.  Talk to your health care provider about your target blood pressure.  If you are between 73-16 years old, ask your health care provider if you should take aspirin to prevent heart disease.  Have regular diabetes screenings by checking your fasting blood sugar level. ? If you are at a normal weight and have a low risk for diabetes, have this test once every three years after the age of 41. ? If you are overweight and have a high risk for diabetes, consider being tested at a younger age or more often.  A one-time screening for abdominal aortic aneurysm (AAA) by ultrasound is recommended for men aged 89-75 years who are current or former smokers. What should I know about preventing infection? Hepatitis B If you have a higher risk for hepatitis B, you should be screened for this virus. Talk with your health care provider to find out if you are at risk for hepatitis B infection. Hepatitis C Blood testing is recommended for:  Everyone born from [birth year] through 1966.  Anyone with known risk factors for hepatitis C.  Sexually Transmitted Diseases (STDs)  You should be  screened each year for STDs including gonorrhea and chlamydia if: ? You are sexually active and are younger than 47 years of age. ? You are older than 47 years of age and your health care provider tells you that you are at risk for this type of infection. ? Your sexual activity has changed since you were last screened and you are at an increased risk for chlamydia or gonorrhea. Ask your health care provider if you are at risk.  Talk with your health care provider about whether you are at high risk of being infected with HIV. Your health care provider may recommend a prescription medicine to help prevent HIV infection.  What else can I do?  Schedule regular health, dental, and eye exams.  Stay current with your vaccines (immunizations).  Do not use any tobacco products, such as cigarettes, chewing tobacco, and e-cigarettes. If you need help quitting, ask your health care provider.  Limit alcohol  intake to no more than 2 drinks per day. One drink equals 12 ounces of beer, 5 ounces of wine, or 1 ounces of hard liquor.  Do not use street drugs.  Do not share needles.  Ask your health care provider for help if you need support or information about quitting drugs.  Tell your health care provider if you often feel depressed.  Tell your health care provider if you have ever been abused or do not feel safe at home. This information is not intended to replace advice given to you by your health care provider. Make sure you discuss any questions you have with your health care provider. Document Released: 09/27/2007 Document Revised: 11/28/2015 Document Reviewed: 01/02/2015 Elsevier Interactive Patient Education  [employer].

## 2016-12-03 ENCOUNTER — Encounter: Payer: Self-pay | Admitting: Internal Medicine

## 2016-12-03 ENCOUNTER — Ambulatory Visit (INDEPENDENT_AMBULATORY_CARE_PROVIDER_SITE_OTHER): Payer: 59 | Admitting: Internal Medicine

## 2016-12-03 VITALS — BP 122/72 | HR 79 | Ht 72.0 in | Wt 183.0 lb

## 2016-12-03 DIAGNOSIS — I480 Paroxysmal atrial fibrillation: Secondary | ICD-10-CM

## 2016-12-03 DIAGNOSIS — R002 Palpitations: Secondary | ICD-10-CM

## 2016-12-03 NOTE — Patient Instructions (Signed)
Medication Instructions:  Your physician recommends that you continue on your current medications as directed. Please refer to the Current Medication list given to you today.   Labwork: None ordered   Testing/Procedures: None ordered   Follow-Up: Your physician wants you to follow-up in: 12 months with Dr Allred You will receive a reminder letter in the mail two months in advance. If you don't receive a letter, please call our office to schedule the follow-up appointment.  Thank you for choosing [facility]!!     Kelly Lanier, RN [phone]   

## 2016-12-03 NOTE — Progress Notes (Signed)
   PCP: Golden Circle, FNP Primary Cardiologist: Dr Tamala Julian Primary EP: Dr Rayann Heman  Daniel Orozco is a 46 y.o. male who presents today for routine electrophysiology followup.  Since last being seen in our clinic, the patient reports doing very well.  Today, he denies symptoms of palpitations, chest pain, shortness of breath,  lower extremity edema, dizziness, presyncope, or syncope.  Doing well.  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  He plays a lot of golf with his 2 sons.  The patient is otherwise without complaint today.   Past Medical History:  Diagnosis Date  . Arthritis   . Crohn disease ([place])    in remission since 2000  . Paroxysmal atrial fibrillation (H[facility])   . S/P balloon dilatation of esophageal stricture    Past Surgical History:  Procedure Laterality Date  . ABLATION    . CARDIOVERSION N/A 03/28/2015   Procedure: CARDIOVERSION;  Surgeon: Dorothy Spark, MD;  Location: [facility] ENDOSCOPY;  Service: Cardiovascular;  Laterality: N/A;  . ELECTROPHYSIOLOGIC STUDY N/A 07/03/2015   atrial fibrillation ablation by Dr Rayann Heman  . ESOPHAGEAL DILATION    . FRACTURE SURGERY    . TEE WITHOUT CARDIOVERSION N/A 03/28/2015   Procedure: TRANSESOPHAGEAL ECHOCARDIOGRAM (TEE);  Surgeon: Dorothy Spark, MD;  Location: [facility] ENDOSCOPY;  Service: Cardiovascular;  Laterality: N/A;    ROS- all systems are reviewed and negatives except as per HPI above  Current Outpatient Prescriptions  Medication Sig Dispense Refill  . allopurinol (ZYLOPRIM) 100 MG tablet Take 1 tablet (100 mg total) by mouth daily. 90 tablet 1  . metoprolol succinate (TOPROL-XL) 25 MG 24 hr tablet Take 1.5 tablets (37.5 mg total) by mouth daily. 135 tablet 3   No current facility-administered medications for this visit.     Physical Exam: Vitals:   12/03/16 1531  BP: 122/72  Pulse: 79  SpO2: 98%  Weight: 183 lb (83 kg)  Height: 6' (1.829 m)    GEN- The patient is well appearing,  alert and oriented x 3 today.   Head- normocephalic, atraumatic Eyes-  Sclera clear, conjunctiva pink Ears- hearing intact Oropharynx- clear Lungs- Clear to ausculation bilaterally, normal work of breathing Heart- Regular rate and rhythm, no murmurs, rubs or gallops, PMI not laterally displaced GI- soft, NT, ND, + BS Extremities- no clubbing, cyanosis, or edema  EKG tracing ordered today is personally reviewed and shows sinus rhythm 79 bpm, otherwise normal ekg  Assessment and Plan:  1. Paroxysmal atrial fibrillation Maintaining sinus rhythm off AAD therapy chads2vasc score is 0.  No anticoagulation is required  Return to see me in a year  [name] MD, [facility] 12/03/2016 3:39 PM

## 2017-01-22 ENCOUNTER — Ambulatory Visit: Payer: 59 | Admitting: Family Medicine

## 2017-03-13 ENCOUNTER — Encounter: Payer: Self-pay | Admitting: Gastroenterology

## 2017-04-09 DIAGNOSIS — B078 Other viral warts: Secondary | ICD-10-CM | POA: Diagnosis not present

## 2017-04-30 ENCOUNTER — Ambulatory Visit: Payer: 59 | Admitting: Gastroenterology

## 2017-05-01 ENCOUNTER — Other Ambulatory Visit: Payer: Self-pay | Admitting: Internal Medicine

## 2017-05-23 ENCOUNTER — Other Ambulatory Visit: Payer: Self-pay | Admitting: Family

## 2017-06-08 ENCOUNTER — Other Ambulatory Visit: Payer: BLUE CROSS/BLUE SHIELD

## 2017-06-08 ENCOUNTER — Encounter: Payer: Self-pay | Admitting: Gastroenterology

## 2017-06-08 ENCOUNTER — Ambulatory Visit: Payer: BLUE CROSS/BLUE SHIELD | Admitting: Gastroenterology

## 2017-06-08 VITALS — BP 100/64 | HR 85 | Ht 73.0 in | Wt 186.0 lb

## 2017-06-08 DIAGNOSIS — K625 Hemorrhage of anus and rectum: Secondary | ICD-10-CM | POA: Diagnosis not present

## 2017-06-08 DIAGNOSIS — R112 Nausea with vomiting, unspecified: Secondary | ICD-10-CM

## 2017-06-08 DIAGNOSIS — Z8379 Family history of other diseases of the digestive system: Secondary | ICD-10-CM | POA: Diagnosis not present

## 2017-06-08 DIAGNOSIS — R1084 Generalized abdominal pain: Secondary | ICD-10-CM

## 2017-06-08 DIAGNOSIS — K9041 Non-celiac gluten sensitivity: Secondary | ICD-10-CM

## 2017-06-08 MED ORDER — NA SULFATE-K SULFATE-MG SULF 17.5-3.13-1.6 GM/177ML PO SOLN: 1.0000 | Freq: Once | ORAL | 0 refills | Status: AC

## 2017-06-08 NOTE — Progress Notes (Signed)
Daniel Orozco    [id number]    08-Mar-1971  Primary Care Physician:Calone, Ples Specter, FNP  Referring Physician: Golden Circle, [address], [place] [address]  Chief complaint: Abdominal bloating, epigastric discomfort, nausea and vomiting                 HPI:  47 year old male is here to reestablish care.  He was previously followed by Dr. Deatra Ina.  He was diagnosed with ? Crohn's disease in the past..  He was on Pentasa briefly for a few years but currently he is not on any medication.  He was never on any immunosuppressive therapy.  Reviewed EGD and colonoscopies that were performed in the past.  No evidence of granulomas or crypt distortion or colitis based on biopsies to suggest inflammatory bowel disease.  Duodenal biopsies showed villous blunting and nonspecific changes.  CT abdomen pelvis in 2011 did not show any abnormality.  Patient states he had a CT abdomen pelvis around 2000 which showed segmental thickening of duodenum or jejunum and at that time it was thought to be Crohn's. Mother has celiac disease. He has been avoiding gluten as he has noticed that he has abdominal pain, nausea, bloating and also vomiting sometimes when he eats gluten. He denies having any diarrhea but on review of chart, it appears he did have diarrhea in the past.  He is following gluten-free diet for many years now, with only occasional accidental consumption of gluten. He denies any joint pain but did report having rash with intense itching and vesicles always at the same spot on his arm.  He does not recall if it correlates to times when he consumes gluten. 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  Denies any weight loss.  He has small volume intermittent bright red blood per rectum and occasional diarrhea.  Denies any relationship with his diet or bowel habits.   Outpatient Encounter  Medications as of 06/08/2017  Medication Sig  . allopurinol (ZYLOPRIM) 100 MG tablet Take 1 tablet (100 mg total) by mouth daily.  . metoprolol succinate (TOPROL-XL) 25 MG 24 hr tablet TAKE 1&1/2 TABLETS (37.5 MG TOTAL) BY MOUTH DAILY.  . Na Sulfate-K Sulfate-Mg Sulf (SUPREP BOWEL PREP KIT) 17.5-3.13-1.6 GM/177ML SOLN Take 1 kit by mouth once for 1 dose.   No facility-administered encounter medications on file as of 06/08/2017.     Allergies as of 06/08/2017 - Review Complete 06/08/2017  Allergen Reaction Noted  . Other Swelling 03/27/2015    Past Medical History:  Diagnosis Date  . Arthritis   . Crohn disease ([place])    in remission since 2000  . Paroxysmal atrial fibrillation (HCC)   . S/P balloon dilatation of esophageal stricture     Past Surgical History:  Procedure Laterality Date  . ABLATION    . CARDIOVERSION N/A 03/28/2015   Procedure: CARDIOVERSION;  Surgeon: Dorothy Spark, MD;  Location: [facility] ENDOSCOPY;  Service: Cardiovascular;  Laterality: N/A;  . ELECTROPHYSIOLOGIC STUDY N/A 07/03/2015   atrial fibrillation ablation by Dr Rayann Heman  . ESOPHAGEAL DILATION    . FRACTURE SURGERY    . TEE WITHOUT CARDIOVERSION N/A 03/28/2015   Procedure: TRANSESOPHAGEAL ECHOCARDIOGRAM (TEE);  Surgeon: Dorothy Spark, MD;  Location: [facility] ENDOSCOPY;  Service: Cardiovascular;  Laterality: N/A;    Family History  Problem Relation Age of Onset  .  Heart disease Maternal Grandfather   . Heart disease Father        RBBB  . Prostate cancer Father   . Healthy Maternal Grandmother   . Healthy Paternal Grandmother   . Healthy Paternal Grandfather   . Healthy Mother   . Celiac disease Mother   . Stomach cancer Neg Hx   . Colon cancer Neg Hx     Social History   Socioeconomic History  . Marital status: Married    Spouse name: Not on file  . Number of children: 3  . Years of education: 97  . Highest education level: Not on file  Social Needs  . Financial resource strain: Not on file    . Food insecurity - worry: Not on file  . Food insecurity - inability: Not on file  . Transportation needs - medical: Not on file  . Transportation needs - non-medical: Not on file  Occupational History  . Occupation: Freight forwarder  Tobacco Use  . Smoking status: Former Smoker    Packs/day: 1.00    Years: 2.00    Pack years: 2.00  . Smokeless tobacco: Never Used  . Tobacco comment: quit in 1999, only smoked for 2 years  Substance and Sexual Activity  . Alcohol use: Yes    Alcohol/week: 0.0 oz    Comment: Occasionally  . Drug use: No  . Sexual activity: Yes    Birth control/protection: Other-see comments  Other Topics Concern  . Not on file  Social History Narrative   Fun: Soccer   Works at [employer] as their [employer]      Review of systems: Review of Systems  Constitutional: Negative for fever and chills.  HENT: Negative.   Eyes: Negative for blurred vision.  Respiratory: Negative for cough, shortness of breath and wheezing.   Cardiovascular: Negative for chest pain and palpitations.  Gastrointestinal: as per HPI Genitourinary: Negative for dysuria, urgency, frequency and hematuria.  Musculoskeletal: Negative for myalgias, back pain and joint pain.  Skin: Negative for itching and rash.  Neurological: Negative for dizziness, tremors, focal weakness, seizures and loss of consciousness.  Endo/Heme/Allergies: Positive for seasonal allergies.  Psychiatric/Behavioral: Negative for depression, suicidal ideas and hallucinations.  All other systems reviewed and are negative.   Physical Exam: Vitals:   06/08/17 1351  BP: 100/64  Pulse: 85   Body mass index is 24.54 kg/m. Gen:      No acute distress HEENT:  EOMI, sclera anicteric Neck:     No masses; no thyromegaly Lungs:    Clear to auscultation bilaterally; normal respiratory effort CV:         Regular rate and rhythm; no murmurs Abd:      + bowel sounds; soft, non-tender; no palpable masses, no  distension Ext:    No edema; adequate peripheral perfusion Skin:      Warm and dry; no rash Neuro: alert and oriented x 3 Psych: normal mood and affect  Data Reviewed:  Reviewed labs, radiology imaging, old records and pertinent past GI work up   Assessment and Plan/Recommendations:  48 year old male with complaints of intermittent nausea, vomiting, abdominal bloating and discomfort.  He also has intermittent diarrhea and bright red blood per rectum. 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 Denzil Magnuson , MD [phone] Mon-Fri 8a-5p [phone] after 5p, weekends, holidays  CC: Golden Circle, FNP

## 2017-06-08 NOTE — Patient Instructions (Signed)
Go to the basement for labs today  You have been scheduled for an endoscopy and colonoscopy. Please follow the written instructions given to you at your visit today. Please pick up your prep supplies at the pharmacy within the next 1-3 days. If you use inhalers (even only as needed), please bring them with you on the day of your procedure.   If you are age 48 or older, your body mass index should be between 23-30. Your Body mass index is 24.54 kg/m. If this is out of the aforementioned range listed, please consider follow up with your Primary Care Provider.  If you are age 103 or younger, your body mass index should be between 19-25. Your Body mass index is 24.54 kg/m. If this is out of the aformentioned range listed, please consider follow up with your Primary Care Provider.

## 2017-06-10 DIAGNOSIS — M791 Myalgia, unspecified site: Secondary | ICD-10-CM | POA: Diagnosis not present

## 2017-06-10 DIAGNOSIS — J111 Influenza due to unidentified influenza virus with other respiratory manifestations: Secondary | ICD-10-CM | POA: Diagnosis not present

## 2017-06-10 DIAGNOSIS — R509 Fever, unspecified: Secondary | ICD-10-CM | POA: Diagnosis not present

## 2017-06-12 ENCOUNTER — Telehealth: Payer: Self-pay | Admitting: Gastroenterology

## 2017-06-15 ENCOUNTER — Encounter: Payer: Self-pay | Admitting: Gastroenterology

## 2017-06-15 NOTE — Telephone Encounter (Signed)
Called patient and told him I have a Suprep sample kit here for him to pick up He will pick up later this week

## 2017-06-18 LAB — CELIAC DISEASE HLA DQ ASSOC.
DQ2 (DQA1 0501/0505, DQB1 02XX): NEGATIVE
DQ8 (DQA1 03XX, DQB1 0302): NEGATIVE

## 2017-06-24 ENCOUNTER — Encounter: Payer: Self-pay | Admitting: Gastroenterology

## 2017-06-24 ENCOUNTER — Other Ambulatory Visit: Payer: Self-pay

## 2017-06-24 ENCOUNTER — Ambulatory Visit (AMBULATORY_SURGERY_CENTER): Payer: BLUE CROSS/BLUE SHIELD | Admitting: Gastroenterology

## 2017-06-24 VITALS — BP 118/70 | HR 75 | Temp 98.6°F | Resp 13 | Ht 73.0 in | Wt 186.0 lb

## 2017-06-24 DIAGNOSIS — D121 Benign neoplasm of appendix: Secondary | ICD-10-CM | POA: Diagnosis not present

## 2017-06-24 DIAGNOSIS — K2900 Acute gastritis without bleeding: Secondary | ICD-10-CM | POA: Diagnosis not present

## 2017-06-24 DIAGNOSIS — K625 Hemorrhage of anus and rectum: Secondary | ICD-10-CM | POA: Diagnosis not present

## 2017-06-24 DIAGNOSIS — R1084 Generalized abdominal pain: Secondary | ICD-10-CM

## 2017-06-24 DIAGNOSIS — D120 Benign neoplasm of cecum: Secondary | ICD-10-CM

## 2017-06-24 DIAGNOSIS — R112 Nausea with vomiting, unspecified: Secondary | ICD-10-CM

## 2017-06-24 DIAGNOSIS — K3189 Other diseases of stomach and duodenum: Secondary | ICD-10-CM | POA: Diagnosis not present

## 2017-06-24 MED ORDER — SODIUM CHLORIDE 0.9 % IV SOLN: 500.0000 mL | Freq: Once | INTRAVENOUS | Status: DC

## 2017-06-24 NOTE — Patient Instructions (Signed)
POLYPS INFORMATION GIVEN.  YOU HAD AN ENDOSCOPIC PROCEDURE TODAY AT [facility]:   Refer to the procedure report that was given to you for any specific questions about what was found during the examination.  If the procedure report does not answer your questions, please call your gastroenterologist to clarify.  If you requested that your care partner not be given the details of your procedure findings, then the procedure report has been included in a sealed envelope for you to review at your convenience later.  YOU SHOULD EXPECT: Some feelings of bloating in the abdomen. Passage of more gas than usual.  Walking can help get rid of the air that was put into your GI tract during the procedure and reduce the bloating. If you had a lower endoscopy (such as a colonoscopy or flexible sigmoidoscopy) you may notice spotting of blood in your stool or on the toilet paper. If you underwent a bowel prep for your procedure, you may not have a normal bowel movement for a few days.  Please Note:  You might notice some irritation and congestion in your nose or some drainage.  This is from the oxygen used during your procedure.  There is no need for concern and it should clear up in a day or so.  SYMPTOMS TO REPORT IMMEDIATELY:   Following lower endoscopy (colonoscopy or flexible sigmoidoscopy):  Excessive amounts of blood in the stool  Significant tenderness or worsening of abdominal pains  Swelling of the abdomen that is new, acute  Fever of 100F or higher   Following upper endoscopy (EGD)  Vomiting of blood or coffee ground material  New chest pain or pain under the shoulder blades  Painful or persistently difficult swallowing  New shortness of breath  Fever of 100F or higher  Black, tarry-looking stools  For urgent or emergent issues, a gastroenterologist can be reached at any hour by calling [phone].   DIET:  We do recommend a small meal at first, but then you may proceed  to your regular diet.  Drink plenty of fluids but you should avoid alcoholic beverages for 24 hours.  ACTIVITY:  You should plan to take it easy for the rest of today and you should NOT DRIVE or use heavy machinery until tomorrow (because of the sedation medicines used during the test).    FOLLOW UP: Our staff will call the number listed on your records the next business day following your procedure to check on you and address any questions or concerns that you may have regarding the information given to you following your procedure. If we do not reach you, we will leave a message.  However, if you are feeling well and you are not experiencing any problems, there is no need to return our call.  We will assume that you have returned to your regular daily activities without incident.  If any biopsies were taken you will be contacted by phone or by letter within the next 1-3 weeks.  Please call us at [phone] if you have not heard about the biopsies in 3 weeks.    SIGNATURES/CONFIDENTIALITY: You and/or your care partner have signed paperwork which will be entered into your electronic medical record.  These signatures attest to the fact that that the information above on your After Visit Summary has been reviewed and is understood.  Full responsibility of the confidentiality of this discharge information lies with you and/or your care-partner.

## 2017-06-24 NOTE — Op Note (Signed)
[place] Patient Name: Daniel Orozco Procedure Date: 06/24/2017 2:47 PM MRN: [id number] Endoscopist: Mauri Pole , MD Age: 47 Referring MD:  Date of Birth: [birth year] Gender: Male Account #: [id number] Procedure:                Colonoscopy Indications:              Evaluation of unexplained GI bleeding, , Follow-up                            of Crohn's disease of the small bowel, Disease                            activity assessment of Crohn's disease of the small                            bowel. Medicines:                Monitored Anesthesia Care Procedure:                Pre-Anesthesia Assessment:                           - Prior to the procedure, a History and Physical                            was performed, and patient medications and                            allergies were reviewed. The patient's tolerance of                            previous anesthesia was also reviewed. The risks                            and benefits of the procedure and the sedation                            options and risks were discussed with the patient.                            All questions were answered, and informed consent                            was obtained. Prior Anticoagulants: The patient has                            taken no previous anticoagulant or antiplatelet                            agents. ASA Grade Assessment: II - A patient with                            mild systemic disease. After reviewing the risks  and benefits, the patient was deemed in                            satisfactory condition to undergo the procedure.                           After obtaining informed consent, the colonoscope                            was passed under direct vision. Throughout the                            procedure, the patient's blood pressure, pulse, and                            oxygen saturations were monitored continuously. The                             Colonoscope was introduced through the anus and                            advanced to the the terminal ileum, with                            identification of the appendiceal orifice and IC                            valve. The colonoscopy was performed without                            difficulty. The patient tolerated the procedure                            well. The quality of the bowel preparation was                            adequate. The terminal ileum, ileocecal valve,                            appendiceal orifice, and rectum were photographed. Scope In: 2:58:33 PM Scope Out: 3:13:29 PM Scope Withdrawal Time: 0 hours 13 minutes 1 second  Total Procedure Duration: 0 hours 14 minutes 56 seconds  Findings:                 The perianal and digital rectal examinations were                            normal.                           A 1 mm polyp was found in the appendiceal orifice.                            The polyp was sessile. The polyp was removed with a  cold biopsy forceps. Resection and retrieval were                            complete.                           The terminal ileum appeared normal. Biopsies were                            taken with a cold forceps for histology.                           Normal mucosa was found in the entire colon.                           Non-bleeding internal hemorrhoids were found during                            retroflexion. The hemorrhoids were small.                           The exam was otherwise without abnormality. Complications:            No immediate complications. Estimated Blood Loss:     Estimated blood loss was minimal. Impression:               - One 1 mm polyp at the appendiceal orifice,                            removed with a cold biopsy forceps. Resected and                            retrieved.                           - The examined portion of the ileum  was normal.                            Biopsied.                           - Normal mucosa in the entire examined colon.                           - Non-bleeding internal hemorrhoids.                           - The examination was otherwise normal. Recommendation:           - Patient has a contact number available for                            emergencies. The signs and symptoms of potential                            delayed complications were discussed with the  patient. Return to normal activities tomorrow.                            Written discharge instructions were provided to the                            patient.                           - Resume previous diet.                           - Continue present medications.                           - Await pathology results.                           - Repeat colonoscopy in 5-10 years for surveillance                            based on pathology results. Mauri Pole, MD 06/24/2017 3:26:36 PM This report has been signed electronically.

## 2017-06-24 NOTE — Progress Notes (Signed)
Called to room to assist during endoscopic procedure.  Patient ID and intended procedure confirmed with present staff. Received instructions for my participation in the procedure from the performing physician.  

## 2017-06-24 NOTE — Progress Notes (Signed)
Report to PACU, RN, vss, BBS= Clear.  

## 2017-06-24 NOTE — Op Note (Signed)
[name] Procedure Date: 06/24/2017 2:47 PM MRN: [id number] Endoscopist: Mauri Pole , MD Age: 47 Referring MD:  Date of Birth: 1970-11-24 Gender: Male Account #: [id number] Procedure:                Upper GI endoscopy Indications:              Epigastric abdominal pain, Dyspepsia, Nausea,                            Vomiting Medicines:                Monitored Anesthesia Care Procedure:                Pre-Anesthesia Assessment:                           - Prior to the procedure, a History and Physical                            was performed, and patient medications and                            allergies were reviewed. The patient's tolerance of                            previous anesthesia was also reviewed. The risks                            and benefits of the procedure and the sedation                            options and risks were discussed with the patient.                            All questions were answered, and informed consent                            was obtained. Prior Anticoagulants: The patient has                            taken no previous anticoagulant or antiplatelet                            agents. ASA Grade Assessment: II - A patient with                            mild systemic disease. After reviewing the risks                            and benefits, the patient was deemed in                            satisfactory condition to undergo the procedure.  After obtaining informed consent, the endoscope was                            passed under direct vision. Throughout the                            procedure, the patient's blood pressure, pulse, and                            oxygen saturations were monitored continuously. The                            Endoscope was introduced through the mouth, and                            advanced to the second part of duodenum. The  upper                            GI endoscopy was accomplished without difficulty.                            The patient tolerated the procedure well. Scope In: Scope Out: Findings:                 Esophagogastric landmarks were identified: the                            Z-line was found at 38 cm and the gastroesophageal                            junction was found at 38 cm from the incisors.                           No gross lesions were noted in the entire esophagus.                           Striped moderately erythematous mucosa without                            bleeding was found in the gastric antrum. Biopsies                            were taken with a cold forceps for histology.                            Biopsies were taken with a cold forceps for                            Helicobacter pylori testing.                           Patchy moderately erythematous mucosa without  active bleeding and with no stigmata of bleeding                            was found in the duodenal bulb. Biopsies were taken                            with a cold forceps for histology.                           The exam was otherwise without abnormality. Complications:            No immediate complications. Estimated Blood Loss:     Estimated blood loss was minimal. Impression:               - Esophagogastric landmarks identified.                           - No gross lesions in esophagus.                           - Erythematous mucosa in the antrum. Biopsied.                           - Erythematous duodenopathy. Biopsied.                           - The examination was otherwise normal. Recommendation:           - Resume previous diet.                           - Continue present medications.                           - Await pathology results. Mauri Pole, MD 06/24/2017 3:23:39 PM This report has been signed electronically.

## 2017-06-25 ENCOUNTER — Telehealth: Payer: Self-pay | Admitting: *Deleted

## 2017-06-25 ENCOUNTER — Telehealth: Payer: Self-pay

## 2017-06-25 NOTE — Telephone Encounter (Signed)
NO ANSWER, MESSAGE LEFT FOR PATIENT. 

## 2017-06-25 NOTE — Telephone Encounter (Signed)
  Follow up Call-  Call back number 06/24/2017  Post procedure Call Back phone  # [phone]  Permission to leave phone message Yes  Some recent data might be hidden     Patient questions:  Do you have a fever, pain , or abdominal swelling? No. Pain Score  0 *  Have you tolerated food without any problems? Yes.    Have you been able to return to your normal activities? Yes.    Do you have any questions about your discharge instructions: Diet   No. Medications  No. Follow up visit  No.  Do you have questions or concerns about your Care? No.  Actions: * If pain score is 4 or above: No action needed, pain <4.

## 2017-07-05 ENCOUNTER — Encounter: Payer: Self-pay | Admitting: Gastroenterology

## 2017-07-25 ENCOUNTER — Emergency Department (HOSPITAL_COMMUNITY)
Admission: EM | Admit: 2017-07-25 | Discharge: 2017-07-25 | Disposition: A | Discharge status: Home / Self Care | Payer: BLUE CROSS/BLUE SHIELD | Attending: Emergency Medicine | Admitting: Emergency Medicine

## 2017-07-25 ENCOUNTER — Other Ambulatory Visit: Payer: Self-pay

## 2017-07-25 ENCOUNTER — Emergency Department (HOSPITAL_COMMUNITY): Payer: BLUE CROSS/BLUE SHIELD

## 2017-07-25 ENCOUNTER — Encounter (HOSPITAL_COMMUNITY): Payer: Self-pay | Admitting: Emergency Medicine

## 2017-07-25 DIAGNOSIS — M79672 Pain in left foot: Secondary | ICD-10-CM

## 2017-07-25 DIAGNOSIS — Z87891 Personal history of nicotine dependence: Secondary | ICD-10-CM | POA: Diagnosis not present

## 2017-07-25 DIAGNOSIS — Z79899 Other long term (current) drug therapy: Secondary | ICD-10-CM | POA: Diagnosis not present

## 2017-07-25 DIAGNOSIS — M7989 Other specified soft tissue disorders: Secondary | ICD-10-CM | POA: Diagnosis not present

## 2017-07-25 DIAGNOSIS — J45909 Unspecified asthma, uncomplicated: Secondary | ICD-10-CM | POA: Insufficient documentation

## 2017-07-25 DIAGNOSIS — M10272 Drug-induced gout, left ankle and foot: Secondary | ICD-10-CM | POA: Diagnosis not present

## 2017-07-25 MED ORDER — HYDROCODONE-ACETAMINOPHEN 5-325 MG PO TABS: 1.0000 | ORAL_TABLET | ORAL | 0 refills | Status: DC | PRN

## 2017-07-25 MED ORDER — HYDROCODONE-ACETAMINOPHEN 5-325 MG PO TABS
1.0000 | ORAL_TABLET | Freq: Once | ORAL | Status: AC
  Administered 2017-07-25: 1 via ORAL
  Filled 2017-07-25: qty 1

## 2017-07-25 MED ORDER — ALLOPURINOL 100 MG PO TABS: 100.0000 mg | ORAL_TABLET | Freq: Every day | ORAL | 0 refills | Status: DC

## 2017-07-25 MED ORDER — PREDNISONE 20 MG PO TABS
60.0000 mg | ORAL_TABLET | Freq: Once | ORAL | Status: AC
  Administered 2017-07-25: 60 mg via ORAL
  Filled 2017-07-25: qty 3

## 2017-07-25 MED ORDER — PREDNISONE 20 MG PO TABS: 40.0000 mg | ORAL_TABLET | Freq: Every day | ORAL | 0 refills | Status: AC

## 2017-07-25 NOTE — Discharge Instructions (Signed)
Please complete 5-day course of steroids as prescribed, you may use Norco as needed for pain.  Do not take this medication while driving, operating machinery and do not combine with alcohol.  Please resume your normal allopurinol.  He will need to follow-up with your primary care doctor.  Return to the ED for worsening pain, fevers or chills or other new or concerning symptoms.

## 2017-07-25 NOTE — ED Provider Notes (Signed)
[facility] EMERGENCY DEPARTMENT Provider Note   CSN: [id number] Arrival date & time: 07/25/17  2040     History   Chief Complaint Chief Complaint  Patient presents with  . Foot Pain    HPI Kamdyn A Montufar is a 47 y.o. male.  Braedin A Hitz is a 47 y.o. Male with a history of gout, Crohn's disease, seasonal allergies, asthma, who presents to the ED for evaluation of pain in his left foot that started suddenly yesterday.  Patient reports pain primarily around the left great toe joint with associated redness and warmth.  Patient reports history of gout and this feels like his typical gout flare.  He has had may be 3 or 4 in the past few years.  This typically improves with steroids and pain medication.  Patient reports he was on allopurinol, but ran out of his prescription and has not been able to get a refill.  Patient also reports he has been eating a lot of gummy Bears while he has been busy at work and these have high fructose corn syrup.  Patient denies any fevers or chills, nausea, vomiting or generalized malaise.  No pain in any other joints other than the left great toe.  Patient is able to move the foot but reports it is painful.  No numbness or tingling.     Past Medical History:  Diagnosis Date  . Allergy   . Arthritis   . Blood transfusion without reported diagnosis   . Crohn disease ([place])    in remission since 2000  . Paroxysmal atrial fibrillation ([facility])   . S/P balloon dilatation of esophageal stricture     Patient Active Problem List   Diagnosis Date Noted  . Routine adult health maintenance 11/21/2016  . A-fib ([place]) 07/03/2015  . Gout 06/26/2015  . PAF (paroxysmal atrial fibrillation) ([place]) 06/26/2015  . Atrial fibrillation with RVR ([place]) 03/27/2015  . GERD 08/07/2009  . CROHN'S [facility] INTESTINE 08/07/2009  . WEIGHT LOSS-ABNORMAL 08/07/2009  . NAUSEA WITH VOMITING 08/07/2009  . NAUSEA ALONE 08/07/2009  . ABDOMINAL  PAIN-PERIUMBILICAL 89/38/1017  . ABDOMINAL PAIN-EPIGASTRIC 08/07/2009  . ABDOMINAL PAIN, UPPER 08/07/2009  . ADHD 08/19/2007  . HAY FEVER 08/19/2007  . ALLERGIC RHINITIS 08/19/2007  . INGUINAL HERNIA, RIGHT 08/19/2007  . ASTHMA 06/22/2007  . CROHN'S DISEASE 06/22/2007  . DYSPHAGIA UNSPECIFIED 06/22/2007  . BENIGN NEOPLASM [place] SITE DIGESTIVE SYSTEM 03/01/2007  . ESOPHAGEAL STRICTURE 01/25/2007  . PAIN IN JOINT, ANKLE/FOOT 08/07/2006  . HALLUX RIGIDUS, ACQUIRED 08/07/2006  . HIATAL HERNIA 11/15/2005  . FOREIGN BODY IN ESOPHAGUS 11/15/2005    Past Surgical History:  Procedure Laterality Date  . ABLATION    . CARDIOVERSION N/A 03/28/2015   Procedure: CARDIOVERSION;  Surgeon: Dorothy Spark, MD;  Location: [facility] ENDOSCOPY;  Service: Cardiovascular;  Laterality: N/A;  . ELECTROPHYSIOLOGIC STUDY N/A 07/03/2015   atrial fibrillation ablation by Dr Rayann Heman  . ESOPHAGEAL DILATION    . FRACTURE SURGERY    . TEE WITHOUT CARDIOVERSION N/A 03/28/2015   Procedure: TRANSESOPHAGEAL ECHOCARDIOGRAM (TEE);  Surgeon: Dorothy Spark, MD;  Location: [facility] ENDOSCOPY;  Service: Cardiovascular;  Laterality: N/A;        Home Medications    Prior to Admission medications   Medication Sig Start Date End Date Taking? Authorizing Provider  allopurinol (ZYLOPRIM) 100 MG tablet Take 1 tablet (100 mg total) by mouth daily. 11/21/16   Golden Circle, FNP  fluticasone (FLONASE) 50 MCG/ACT nasal spray Place into both nostrils  daily.    [provider]  metoprolol succinate (TOPROL-XL) 25 MG 24 hr tablet TAKE 1&1/2 TABLETS (37.5 MG TOTAL) BY MOUTH DAILY. 05/04/17   Allred, Jeneen Rinks, MD  Multiple Vitamin (MULTIVITAMIN) tablet Take 1 tablet by mouth daily.    [provider]    Family History Family History  Problem Relation Age of Onset  . Heart disease Maternal Grandfather   . Heart disease Father        RBBB  . Prostate cancer Father   . Healthy Maternal Grandmother   . Healthy  Paternal Grandmother   . Healthy Paternal Grandfather   . Prostate cancer Paternal Grandfather   . Healthy Mother   . Celiac disease Mother   . Stomach cancer Neg Hx   . Colon cancer Neg Hx     Social History Social History   Tobacco Use  . Smoking status: Former Smoker    Packs/day: 1.00    Years: 2.00    Pack years: 2.00  . Smokeless tobacco: Never Used  . Tobacco comment: quit in 1999, only smoked for 2 years  Substance Use Topics  . Alcohol use: Yes    Alcohol/week: 0.0 oz    Comment: Occasionally  . Drug use: No     Allergies   Other   Review of Systems Review of Systems  Constitutional: Negative for chills and fever.  Gastrointestinal: Negative for nausea and vomiting.  Musculoskeletal: Positive for arthralgias and joint swelling.  Skin: Positive for color change. Negative for pallor, rash and wound.  Neurological: Negative for weakness and numbness.     Physical Exam Updated Vital Signs BP 132/84 (BP Location: Right Arm)   Pulse 97   Temp 99.8 F (37.7 C) (Oral)   Resp 16   SpO2 100%   Physical Exam  Constitutional: He appears well-developed and well-nourished. No distress.  HENT:  Head: Normocephalic and atraumatic.  Eyes: Right eye exhibits no discharge. Left eye exhibits no discharge.  Pulmonary/Chest: Effort normal. No respiratory distress.  Musculoskeletal:  Erythema, warmth and tenderness to palpation over the left first MTP joint.  Patient able to wiggle all of his toes, increased pain with range of motion.  No areas of fluctuance, no wounds.  Sensation intact, 2+ DP and TP pulses and good capillary refill.  No pain at the ankle or knee.  Neurological: He is alert. Coordination normal.  Skin: Skin is warm and dry. Capillary refill takes less than 2 seconds. He is not diaphoretic. There is erythema.  Psychiatric: He has a normal mood and affect. His behavior is normal.  Nursing note and vitals reviewed.    ED Treatments / Results   Labs (all labs ordered are listed, but only abnormal results are displayed) Labs Reviewed - No data to display  EKG None  Radiology Dg Foot Complete Left  Result Date: 07/25/2017 CLINICAL DATA:  Left foot pain and redness for 2 days. EXAM: LEFT FOOT - COMPLETE 3+ VIEW COMPARISON:  None. FINDINGS: Frontal, oblique, and lateral views obtained. There is soft tissue swelling medial to the first MTP joint. There are subtle erosions along the medial aspect of the first MTP joint. Other joint spaces appear normal. No periostitis. No fracture or dislocation. IMPRESSION: Subtle erosion along the medial first MTP joint with soft tissue swelling in this area. Suspect early changes of gout. Other joint spaces appear normal.  No fracture or dislocation. Electronically Signed   By: Lowella Grip III M.D.   On: 07/25/2017 21:43  Procedures Procedures (including critical care time)  Medications Ordered in ED Medications  predniSONE (DELTASONE) tablet 60 mg (60 mg Oral Given 07/25/17 2229)  HYDROcodone-acetaminophen (NORCO/VICODIN) 5-325 MG per tablet 1 tablet (1 tablet Oral Given 07/25/17 2229)     Initial Impression / Assessment and Plan / ED Course  I have reviewed the triage vital signs and the nursing notes.  Pertinent labs & imaging results that were available during my care of the patient were reviewed by me and considered in my medical decision making (see chart for details).  Patient presents to the ED with left foot pain, warmth.  No associated fevers or chills.  Patient reports the symptoms are exactly like his usual gout flares.  Recently ran out of allopurinol and has been eating increasing amounts of high fructose corn syrup.  On exam patient with normal vitals and overall well-appearing.  Exam consistent with gout, left lower extremity is neurovascularly intact.  Story and exam not suggestive of septic arthritis.  X-ray shows subtle erosion and swelling over the first MTP joint of the  left foot suggestive of early gout changes.  Will treat with steroids and Norco for pain.  Will refill patient's allopurinol.  Patient stable for discharge home with primary care follow-up.  Return precautions discussed.  Patient expresses understanding and is in agreement with plan.  Final Clinical Impressions(s) / ED Diagnoses   Final diagnoses:  Foot pain, left  Acute drug-induced gout involving toe of left foot    ED Discharge Orders        Ordered    allopurinol (ZYLOPRIM) 100 MG tablet  Daily     07/25/17 2234    predniSONE (DELTASONE) 20 MG tablet  Daily     07/25/17 2234    HYDROcodone-acetaminophen (NORCO) 5-325 MG tablet  Every 4 hours PRN     07/25/17 2234       Jacqlyn Larsen, PA-C 07/25/17 2235    Duffy Bruce, MD 07/26/17 1109

## 2017-07-25 NOTE — ED Triage Notes (Signed)
Patient presents to ED for assessment of left foot pain sudden onset yesterday.  States hx of Gout, feels the same.  States he has been eating "a lot of gummy bears".

## 2017-07-25 NOTE — ED Notes (Signed)
Patient transported to X-ray 

## 2017-08-24 ENCOUNTER — Ambulatory Visit: Payer: BLUE CROSS/BLUE SHIELD | Admitting: Family Medicine

## 2017-08-24 ENCOUNTER — Encounter: Payer: Self-pay | Admitting: Family Medicine

## 2017-08-24 VITALS — BP 121/87 | HR 78 | Ht 73.0 in | Wt 180.2 lb

## 2017-08-24 DIAGNOSIS — J3089 Other allergic rhinitis: Secondary | ICD-10-CM | POA: Diagnosis not present

## 2017-08-24 DIAGNOSIS — Z1389 Encounter for screening for other disorder: Secondary | ICD-10-CM | POA: Insufficient documentation

## 2017-08-24 DIAGNOSIS — M109 Gout, unspecified: Secondary | ICD-10-CM | POA: Insufficient documentation

## 2017-08-24 DIAGNOSIS — I4891 Unspecified atrial fibrillation: Secondary | ICD-10-CM

## 2017-08-24 DIAGNOSIS — Z8042 Family history of malignant neoplasm of prostate: Secondary | ICD-10-CM | POA: Insufficient documentation

## 2017-08-24 DIAGNOSIS — K9041 Non-celiac gluten sensitivity: Secondary | ICD-10-CM | POA: Diagnosis not present

## 2017-08-24 MED ORDER — ALLOPURINOL 100 MG PO TABS: 100.0000 mg | ORAL_TABLET | Freq: Every day | ORAL | 0 refills | Status: DC

## 2017-08-24 NOTE — Progress Notes (Signed)
New patient office visit note:  Impression and Recommendations:    1. Gouty arthropathy   2. Non-celiac gluten sensitivity   3. Atrial fibrillation with RVR ([place])- tx per Cards   4. Environmental and seasonal allergies   5. Screening for multiple conditions   6. Family history of prostate cancer in father    43. Gouty Arthropathy - Patient takes allopurinol daily.  - Allopurinol refilled today.  1. Blood Pressure - Reviewed that normal blood pressure is 119/79 or less.  - Continue to monitor heart rate and blood pressure closely.  If HR runs in the high 80's on a regular basis, advised patient he may need to consider medication options.  2. Paroxysmal Afib with RVR - Cardiology Follow-Up - Seen by Dr. Rayann Heman 12/03/16, and was told to follow up in one year.  Patient maintained on beta blocker without anticoagulation.   - Treatment changes for this per cardiology.  3. Exercise & Dietary Habits - Advised patient to continue exercising to improve health.    - Continue striving for over 15,000 steps daily.  Recommended that the patient eventually strive for at least 150 minutes of moderate cardiovascular activity per week according to guidelines established by the [facility].   - Healthy dietary habits encouraged, including low-carb, and high amounts of lean protein in diet.   - Patient should also consume adequate amounts of water - half of body weight in oz of water per day.   Education and routine counseling performed. Handouts provided.  4. Follow-Up - Per patient, last labs were close to 2 years ago.  - In the near future, CPE scheduled along with fasting lab work to establish baseline.   Orders Placed This Encounter  Procedures  . Lipid panel  . Hemoglobin A1c  . VITAMIN D 25 Hydroxy (Vit-D Deficiency, Fractures)  . TSH  . CBC with Differential/Platelet  . Comprehensive metabolic panel    Meds ordered this encounter  Medications  . allopurinol  (ZYLOPRIM) 100 MG tablet    Sig: Take 1 tablet (100 mg total) by mouth daily.    Dispense:  30 tablet    Refill:  0    Gross side effects, risk and benefits, and alternatives of medications discussed with patient.  Patient is aware that all medications have potential side effects and we are unable to predict every side effect or drug-drug interaction that may occur.  Expresses verbal understanding and consents to current therapy plan and treatment regimen.  Return for Follow-up near future for complete physical exam-come fasting will obtain labs.  Please see AVS handed out to patient at the end of our visit for further patient instructions/ counseling done pertaining to today's office visit.    Note: This document was prepared using Dragon voice recognition software and may include unintentional dictation errors.     This document serves as a record of services personally performed by Mellody Dance, DO. It was created on her behalf by Toni Amend, a trained medical scribe. The creation of this record is based on the scribe's personal observations and the provider's statements to them.   I have reviewed the above medical documentation for accuracy and completeness and I concur.  Mellody Dance 08/24/17 5:02 PM    -------------------------------------------------------------------------------------   Subjective:    Chief complaint:   Chief Complaint  Patient presents with  . Establish Care    HPI: Daniel Orozco is a pleasant 47 y.o. male who presents to  [facility] today to review their medical history with me and establish care.   I asked the patient to review their chronic problem list with me to ensure everything was updated and accurate.    All recent office visits with other providers, any medical records that patient brought in etc  - I reviewed today.     We asked pt to get [place] their medical records from [facility] providers/ specialists  that they had seen within the past 3-5 years- if they are in private practice and/or do not work for [employer], [facility], [place], [place] or [employer] owned practice.  Told them to call their specialists to clarify this if they are not sure.   Patient's wife Andee Poles recommended that he establish care here.  Social History Wife Danielle. Twin sons that play soccer.  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  Tobacco Use Smoked for 2 years. Hasn't had a cigarette since 1996.  EtOH Use Drinks 3-4 per week. Enjoys whisky.  Historical Activity Level Patient played soccer growing up all the way into college, intramural. Grew up skating, skiing, surfing, mountain biking.  Patient also enjoys playing golf. In nice weather, plays golf about 4 days per week.  Family History Prostate cancer in father.  Surgical History Had reconstructive surgeries on his foot. Has a plate in the right foot.  Past Medical History  - Crohn's Disease - Resolved Diagnosed with Crohn's in 2000 by Dr. Deatra Ina. Notes "had six feet of his small intestine shut down."  Goes to see Dr. Silverio Decamp now for gastroenterology.  Had colonoscopy, endoscopy, pathology, etc. Discovered that he actually has acute gluten sensitivity; not Celiac. 15-20 minutes after he eats bread, he feels sick. Patient completely stopped eating bread and hasn't had any in 4-5 months.  - Gouty Arthropathy Has been diagnosed with gout in the left great toe.  Patient takes allopurinol every day.  - History of Cardiac Ablation 2016, fell on the floor at work. Watch said that his heart rate was 232 to 239. Went to see Dr Tamala Julian, with cardiology. Patient was told that the only reason he was still alive was because he was fit. Had cardiac ablation done at that time, "which fixed it." Hasn't thad a problem since.  Notes that after this event,  his resting heart rate increased from 65 to 75-85.  Still takes metoprolol every so often. Doctor said he can stop taking it or take it. Patient notes that he watches his heart rate.  Was told he does not need to follow-up regularly with cardiology unless he has a problem.  - Seasonal Allergies Has to take Flonase every year when allergies flare.  - Physical Activity Habits Walking or golf 3 days per week. Patient walks the golf course.  Notes 17,000-21,000 steps per day.   Wt Readings from Last 3 Encounters:  08/24/17 180 lb 3.2 oz (81.7 kg)  06/24/17 186 lb (84.4 kg)  06/08/17 186 lb (84.4 kg)   BP Readings from Last 3 Encounters:  08/24/17 121/87  07/25/17 (!) 126/93  06/24/17 118/70   Pulse Readings from Last 3 Encounters:  08/24/17 78  07/25/17 98  06/24/17 75   BMI Readings from Last 3 Encounters:  08/24/17 23.77 kg/m  06/24/17 24.54 kg/m  06/08/17 24.54 kg/m    Patient Care Team    Relationship Specialty Notifications Start End  Novalyn Lajara,  Neoma Laming, DO PCP - General Family Medicine  08/24/17   [name], MD Consulting Physician Cardiology  08/24/17    Comment: EP Doc  Mauri Pole, MD Consulting Physician Gastroenterology  08/24/17   Belva Crome, MD Consulting Physician Cardiology  08/24/17     Patient Active Problem List   Diagnosis Date Noted  . Atrial fibrillation with RVR ([place]) 03/27/2015    Priority: High  . Gouty arthropathy 08/24/2017    Priority: Medium  . Non-celiac gluten sensitivity 08/24/2017    Priority: Medium  . Mood disorder ([place]) 02/23/2014    Priority: Medium  . Environmental and seasonal allergies 08/24/2017    Priority: Low  . Family history of prostate cancer in father 08/24/2017    Priority: Low  . Chronic daily headache 02/23/2014    Priority: Low  . GERD 08/07/2009    Priority: Low  . ASTHMA 06/22/2007    Priority: Low  . Screening for multiple conditions 08/24/2017  . Routine adult health maintenance  11/21/2016  . A-fib ([place]) 07/03/2015  . Gout 06/26/2015  . PAF (paroxysmal atrial fibrillation) ([place]) 06/26/2015  . Snoring 02/23/2014  . Vision changes 02/23/2014  . CROHN'S [facility] INTESTINE 08/07/2009  . WEIGHT LOSS-ABNORMAL 08/07/2009  . NAUSEA WITH VOMITING 08/07/2009  . NAUSEA ALONE 08/07/2009  . ABDOMINAL PAIN-PERIUMBILICAL 38/25/0539  . ABDOMINAL PAIN-EPIGASTRIC 08/07/2009  . ABDOMINAL PAIN, UPPER 08/07/2009  . ADHD 08/19/2007  . HAY FEVER 08/19/2007  . ALLERGIC RHINITIS 08/19/2007  . INGUINAL HERNIA, RIGHT 08/19/2007  . CROHN'S DISEASE 06/22/2007  . DYSPHAGIA UNSPECIFIED 06/22/2007  . BENIGN NEOPLASM [place] SITE DIGESTIVE SYSTEM 03/01/2007  . ESOPHAGEAL STRICTURE 01/25/2007  . PAIN IN JOINT, ANKLE/FOOT 08/07/2006  . HALLUX RIGIDUS, ACQUIRED 08/07/2006  . HIATAL HERNIA 11/15/2005  . FOREIGN BODY IN ESOPHAGUS 11/15/2005     Past Medical History:  Diagnosis Date  . Allergy   . Arthritis   . Blood transfusion without reported diagnosis   . Crohn disease ([place])    in remission since 2000  . Paroxysmal atrial fibrillation ([facility])   . S/P balloon dilatation of esophageal stricture      Past Medical History:  Diagnosis Date  . Allergy   . Arthritis   . Blood transfusion without reported diagnosis   . Crohn disease ([place])    in remission since 2000  . Paroxysmal atrial fibrillation ([facility])   . S/P balloon dilatation of esophageal stricture      Past Surgical History:  Procedure Laterality Date  . ABLATION    . CARDIOVERSION N/A 03/28/2015   Procedure: CARDIOVERSION;  Surgeon: Dorothy Spark, MD;  Location: [facility] ENDOSCOPY;  Service: Cardiovascular;  Laterality: N/A;  . ELECTROPHYSIOLOGIC STUDY N/A 07/03/2015   atrial fibrillation ablation by Dr Rayann Heman  . ESOPHAGEAL DILATION    . FRACTURE SURGERY    . TEE WITHOUT CARDIOVERSION N/A 03/28/2015   Procedure: TRANSESOPHAGEAL ECHOCARDIOGRAM (TEE);  Surgeon: Dorothy Spark, MD;  Location: [facility] ENDOSCOPY;   Service: Cardiovascular;  Laterality: N/A;     Family History  Problem Relation Age of Onset  . Heart disease Maternal Grandfather   . Heart disease Father        RBBB  . Prostate cancer Father   . Healthy Maternal Grandmother   . Healthy Paternal Grandmother   . Healthy Paternal Grandfather   . Prostate cancer Paternal Grandfather   . Healthy Mother   . Celiac disease Mother   . Stomach cancer Neg Hx   . Colon cancer Neg Hx  Social History   Substance and Sexual Activity  Drug Use No     Social History   Substance and Sexual Activity  Alcohol Use Yes  . Alcohol/week: 0.0 oz   Comment: Occasionally     Social History   Tobacco Use  Smoking Status Former Smoker  . Packs/day: 1.00  . Years: 2.00  . Pack years: 2.00  Smokeless Tobacco Never Used  Tobacco Comment   quit in 1999, only smoked for 2 years     Current Meds  Medication Sig  . allopurinol (ZYLOPRIM) 100 MG tablet Take 1 tablet (100 mg total) by mouth daily.  . fluticasone (FLONASE) 50 MCG/ACT nasal spray Place into both nostrils daily.  . metoprolol succinate (TOPROL-XL) 25 MG 24 hr tablet TAKE 1&1/2 TABLETS (37.5 MG TOTAL) BY MOUTH DAILY.  . [DISCONTINUED] allopurinol (ZYLOPRIM) 100 MG tablet Take 1 tablet (100 mg total) by mouth daily.   Current Facility-Administered Medications for the 08/24/17 encounter (Office Visit) with Mellody Dance, DO  Medication  . 0.9 %  sodium chloride infusion    Allergies: Other   Review of Systems  Constitutional: Negative for chills, diaphoresis, fever, malaise/fatigue and weight loss.  HENT: Negative for congestion, sore throat and tinnitus.   Eyes: Negative for blurred vision, double vision and photophobia.  Respiratory: Negative for cough and wheezing.   Cardiovascular: Negative for chest pain and palpitations.  Gastrointestinal: Negative for blood in stool, diarrhea, nausea and vomiting.  Genitourinary: Negative for dysuria, frequency and  urgency.  Musculoskeletal: Negative for joint pain and myalgias.  Skin: Negative for itching and rash.  Neurological: Negative for dizziness, focal weakness, weakness and headaches.  Endo/Heme/Allergies: Negative for environmental allergies and polydipsia. Does not bruise/bleed easily.  Psychiatric/Behavioral: Negative for depression and memory loss. The patient is not nervous/anxious and does not have insomnia.      Objective:   Blood pressure 121/87, pulse 78, height 6' 1"  (1.854 m), weight 180 lb 3.2 oz (81.7 kg), SpO2 98 %. Body mass index is 23.77 kg/m. General: Well Developed, well nourished, and in no acute distress.  Neuro: Alert and oriented x3, extra-ocular muscles intact, sensation grossly intact.  HEENT:[place]/AT, PERRLA, neck supple, No carotid bruits Skin: no gross rashes  Cardiac: Regular rate and rhythm Respiratory: Essentially clear to auscultation bilaterally. Not using accessory muscles, speaking in full sentences.  Abdominal: not grossly distended Musculoskeletal: Ambulates w/o diff, FROM * 4 ext.  Vasc: less 2 sec cap RF, warm and pink  Psych:  No HI/SI, judgement and insight good, Euthymic mood. Full Affect.    Recent Results (from the past 2160 hour(s))  Celiac Disease HLA DQ Assoc.     Status: None   Collection Time: 06/08/17  3:10 PM  Result Value Ref Range   DQ2 (DQA1 0501/0505,DQB1 02XX) Negative    DQ8 (DQA1 03XX, DQB1 0302) Negative     Comment: Final Results: DQA1*01:AXZVC,- DQB1*06:BJDZZ,06:BJEAA Code Translation: AXZVC            01:02/01:08/01:09/01:11/01:16N BJDZZ            06:02/06:03/06:41/06:44/06:46/06:47/06:72                  /06:73/06:74/06:80/06:84/06:90/06:95/06:97                  /08:657/84:696/29:528/41:324/40:102/72:536                  /06:115/06:116/06:117/06:124/06:125/06:126                  /64:403/47:425/95:638/75:643/32:951/88:416                  /  97:353/29:924/26:834/19:622/29:798                   /92:119E/17:408/14:481/85:631/49:702                  /06:197/06:200/06:211/06:213/06:215                  /06:216N/06:218/06:219/06:221/06:223                  /63:785/88:502/77:412/87:867/67:209/47:096                  /06:235/06:236/06:237/06:240/06:242/06:249                  /28:366/29:476/54:650/35:465/68:127/51:700                  /06:264/06:271/06:272/06:273 BJEAA            06:04/06:22/06:34/06:36/06:38/06:39/06:52                  /06:58/06:85/06:86/06:93/06:135/06:155                   /06:158N/06:160/06:164/06:171/06:186                  /06:202/06:204/06:217/06:241/06:252N                  /17:494/49:675/91:638/46:659/93:570 The patient is not positive for any of the HLA DQ risk alleles.  Celiac Disease risk from the HLA DQA/DQB genotype is approximately 1:2518 (<0.04%). Allele interpretation for all loci based on IMGT/HLA database version 3.33.0 HLA Lab CLIA ID Number [id number] Greater than 95% of celiac patients are positive for either DQ2 or DQ8 (Sollid and Warren, (1993)  Gastroenterology [phone]). However these antigens may also be present in patients who do not have Celiac disease.    COMMENT: Comment     Comment: This test was performed using Polymerase Chain Reaction/(PCR)Sequence Specific Oligonucleotide Probes (SSOP) (Luminex) technique. Sequence Based Typing (SBT) and/or Sequence Specific Primers (SSP) may be used as supplemental methods when necessary. Please contact HLA Customer Service at [phone] if you have any questions. Director of HLA Laboratory Dr Brooks Sailors, PhD    Additional Information: Comment     Comment: Celiac disease is a chronic immune-mediated inflammatory disorder with multi-systemic manifestations, both gastrointestinal and non- gastrointestinal. In genetically susceptible individuals, ingestion of gluten can cause inflammation and damage to the small intestine mucosa. Celiac disease has an incidence of 1:100 in the  [place]. In order for celiac disease to develop, human leukocyte antigen (HLA) molecule DQ2 (encoded by alleles DQA1*0501 or *0505 plus DQB1*0201 or *0202), half of the DQ2 molecule, or DQ8 (encoded DQA*03 plus DQB1*0302) must be present. These molecules confer susceptibility to celiac disease by binding to gluten and interacting with intestinal T cells, leading to a pathologic immune response involving autoimmunity. The familial nature of susceptibility to celiac disease is shown by an 11-18% prevalence of this disorder in siblings of individuals with celiac disease and a 70% concordance rate between identical twins. Among celiac disease patients , >90% carry DQ2, 5-10% carry DQ8, and the remaining carry half DQ2. The presence of DQ2, half DQ2, or DQ8 alone is not sufficient for a diagnosis of celiac disease. Clinical symptoms, positive test results for endomysial, tissue transglutaminase or deamidated gliadin peptide antibodies, or abnormal small bowel biopsy results all support a diagnosis of celiac disease. Most individuals with a positive genetic result do not develop celiac disease. The risk for developing celiac disease in individuals with a positive genetic result approaches 40% if there is a known first degree  relative with celiac disease. Table: Genetic Risk from HLA-DQA/DQB Genotypes ______________________________________________________________                 Genotype                         Risk  ________________________________________ ___________________                 DQ2 + DQ8                     1:7 (14.3%)  ________________________________________ ___________________       DQ2 + DQ2 OR DQ2 Ho mozygous *02          1:10 (10%)  ________________________________________ ___________________                  DQ8 + DQ8                    1:12 (8.4%)  ________________________________________ ___________________                DQ8 + DQB1*02                  1:24  (4.2%)  ________________________________________ ___________________              Homozygous DQB*02                1:26 (3.8%)  ________________________________________ ___________________                   DQ2 alone                   1:35 (2.9%)  ________________________________________ ___________________                   DQ8 alone                   1:89 (1.1%)  ________________________________________ ___________________     Population risk (genotype unknown)        1:100 (1%)  ________________________________________ ___________________                1/2 DQ2:DQB1*02               1:210 (0.5%)  ________________________________________ ___________________                1/2 RX5:QMG8*67              6:1950  (0.05%)  ________________________________________ ___________________    No HLA-DQA/DQB susceptibility alleles    1:2518 (<0.04%)  ________________________________________ ___________________ From Megiorni et al. 2009 for all genotypes except DQ8 + DQ8 DQ8 + DQ8 risk is from [employer] al. 2009 Other influences on risk for celiac disease The overall risk for an individual to develop celiac disease is influenced not just by genetic risk from the HLA-DQA/DQB genotype, but by presence of symptoms of celiac disease, positive results for celiac antibody tests or intestinal biopsy, and having relatives with celiac disease. Celiac disease risk is also higher in individuals with IgA deficiency, Down syndrome, Turner syndrome, and the autoimmune disorders Type I diabetes mellitus, Sjogren syndrome, and thyroiditis. There are also additional genetic influences on the development of celiac disease in individuals predisposed to the disorder. References: 1. Green PHR and Cellier  C. Celiac Disease. Arville Lime Med 2007;    357:[phone]. 2. Megiorni F, Max Fickle, Bonamico M et al. HLA-DQ and risk gradient    for celiac disease. Hum Immunol 2009;  70:55-59. 3. Pietzak MM, Schofield TC, McGinnis  FM et al. Stratifying risk    for celiac disease in a large at-risk [place] population    by using HLA alleles. Clin Gastroenterol Hepatol 2009; [phone]. 4. Sollid LM and Lie BA. (2005). Celiac Disease Genetics: Current    Concepts and Information systems manager. Clin Gastroenterol and    Hepat 5:053-976. 5. Domenica Fail, Young DO, Green PHR, et al. Celiac Disease.    In: Pagon RA, Nicky Pugh Caleen Essex, editors. GeneReviews    (Internet), [employer] [place], [place], October 15, 2006:1-27.    [email or web address]=[email or web address]    PMID [id number] (PubMed) 6. Treem W. Emerging concepts in celiac disease. Curr Opin Pediatr    2004;16:552-559.

## 2017-08-24 NOTE — Patient Instructions (Signed)

## 2017-09-14 ENCOUNTER — Encounter: Payer: Self-pay | Admitting: Family Medicine

## 2017-09-14 ENCOUNTER — Other Ambulatory Visit: Payer: Self-pay

## 2017-09-14 DIAGNOSIS — M109 Gout, unspecified: Secondary | ICD-10-CM

## 2017-09-14 MED ORDER — ALLOPURINOL 100 MG PO TABS: 100.0000 mg | ORAL_TABLET | Freq: Every day | ORAL | 0 refills | Status: DC

## 2017-09-14 NOTE — Telephone Encounter (Signed)
Refill on Allopurinol. MPulliam, CMA/RT(R)

## 2017-09-16 ENCOUNTER — Ambulatory Visit: Payer: BLUE CROSS/BLUE SHIELD | Admitting: Family Medicine

## 2017-10-13 ENCOUNTER — Encounter: Payer: Self-pay | Admitting: Family Medicine

## 2017-10-13 ENCOUNTER — Other Ambulatory Visit: Payer: Self-pay

## 2017-10-13 ENCOUNTER — Ambulatory Visit (INDEPENDENT_AMBULATORY_CARE_PROVIDER_SITE_OTHER): Payer: BLUE CROSS/BLUE SHIELD | Admitting: Family Medicine

## 2017-10-13 VITALS — BP 119/86 | HR 81 | Ht 73.0 in | Wt 184.7 lb

## 2017-10-13 DIAGNOSIS — M109 Gout, unspecified: Secondary | ICD-10-CM

## 2017-10-13 DIAGNOSIS — I4891 Unspecified atrial fibrillation: Secondary | ICD-10-CM | POA: Diagnosis not present

## 2017-10-13 DIAGNOSIS — Z8042 Family history of malignant neoplasm of prostate: Secondary | ICD-10-CM | POA: Diagnosis not present

## 2017-10-13 DIAGNOSIS — E559 Vitamin D deficiency, unspecified: Secondary | ICD-10-CM | POA: Diagnosis not present

## 2017-10-13 DIAGNOSIS — Z1389 Encounter for screening for other disorder: Secondary | ICD-10-CM

## 2017-10-13 DIAGNOSIS — Z0000 Encounter for general adult medical examination without abnormal findings: Secondary | ICD-10-CM | POA: Diagnosis not present

## 2017-10-13 MED ORDER — ALLOPURINOL 100 MG PO TABS: 100.0000 mg | ORAL_TABLET | Freq: Every day | ORAL | 1 refills | Status: DC

## 2017-10-13 NOTE — Patient Instructions (Signed)

## 2017-10-13 NOTE — Progress Notes (Signed)
Male Physical  Impression and Recommendations:    1. Routine adult health maintenance   2. Screening for multiple conditions   3. Family history of prostate cancer in father   51. Atrial fibrillation, unspecified type ([place])     1) Anticipatory Guidance: Discussed importance of wearing a seatbelt while driving, not texting while driving; sunscreen when outside along with skin surveillance; eating a balanced and modest diet; physical activity at least 25 minutes per day or 150 min/ week moderate to intense activity.  - Encouraged patient to perform a self-check on the testicles every month.  2) Immunizations / Screenings / Labs: All immunizations are up-to-date per recommendations or will be updated today. Patient is due for dental and vision screens which pt will schedule independently. Will obtain CBC, CMP, HgA1c, Lipid panel, TSH and vit D when fasting, if not already done recently.   - Continue with GI screenings every 5 years.  3) Weight:  BMI meaning discussed with patient.  Discussed goal of losing 5-10% of current body weight which would improve overall feelings of well being and improve objective health data. Improve nutrient density of diet through increasing intake of fruits and vegetables and decreasing saturated fats, white flour products and refined sugars.   Explained to patient what BMI refers to, and what it means medically.    Told patient to think about it as a "medical risk stratification measurement" and how increasing BMI is associated with increasing risk/ or worsening state of various diseases such as hypertension, hyperlipidemia, diabetes, premature OA, depression etc.  American Heart Association guidelines for healthy diet, basically Mediterranean diet, and exercise guidelines of 30 minutes 5 days per week or more discussed in detail.  Health counseling performed.  All questions answered.  Exercise & Lifestyle - Advised patient to continue working  toward exercising to improve health.    - Continue with physical activity 4-5 days per week.  Recommended that the patient eventually strive for at least 150 minutes of moderate cardiovascular activity per week according to guidelines established by the [facility].   - Healthy dietary habits encouraged, including low-carb, and high amounts of lean protein in diet.   - Patient should also consume adequate amounts of water - half of body weight in oz of water per day.  4) Folllow-Up: - Patient knows to keep an eye out for increased frequency of elevated heart rate, and associated SOB, chest pain, jaw pain, dizziness, and other cardiac symptoms.  - Return as scheduled for regular chronic follow-up.   Orders Placed This Encounter  Procedures  . T4, free  . PSA    No orders of the defined types were placed in this encounter.    Gross side effects, risk and benefits, and alternatives of medications discussed with patient.  Patient is aware that all medications have potential side effects and we are unable to predict every side effect or drug-drug interaction that may occur.  Expresses verbal understanding and consents to current therapy plan and treatment regimen.  Please see AVS handed out to patient at the end of our visit for further patient instructions/ counseling done pertaining to today's office visit.  Follow-up preventative CPE in 1 year. Follow-up office visit pending lab work.  F/up sooner for chronic care management and/or prn  This document serves as a record of services personally performed by Mellody Dance, DO. It was created on her behalf by Toni Amend, a trained medical scribe. The creation of this record  is based on the scribe's personal observations and the provider's statements to them.   I have reviewed the above medical documentation for accuracy and completeness and I concur.  Mellody Dance 10/13/17 5:07 PM      Subjective:    CC: CPE  HPI: Daniel Orozco is a 47 y.o. male who presents to [place] at [facility] today for a yearly health maintenance exam.    Health Maintenance Summary Reviewed and updated, unless pt declines services.  Colonoscopy:     Had colonoscopy and endoscopy within the last 3 months for his ongoing GI concerns; everything was okay.  He had a biopsy and "genome" work done with Dr. Silverio Decamp.  He is on a 5-year repeat schedule. Tobacco History Reviewed:   Former smoker, quit; 2 pack-years. CT scan for screening lung CA:   n/a Alcohol:    No concerns, no excessive use Exercise Habits:   Walks on the golf [place] about 4-5 days per week. STD concerns:   none Drug Use:   None Birth control method:   n/a Testicular/penile concerns: Patient does not perform testicular exams on himself. Patient has never had his prostate examined; states that his father had prostate cancer.  Notes that his heart rate will go up to 130's-140's sometimes, but denies his elevated heart rate being sustained.  Sees Dr. Rayann Heman for Cardiology (to monitor his atrial fibrillation) and Dr. Silverio Decamp for GI.  Follows up with dentist off [address].  Patient does not have a dermatologist and doesn't do skin screenings.  Denies new or worrisome spots, or any changing areas on his skin.  Notes that his GI symptoms have been good; denies diarrhea or constipation.  States that he's gotta watch what he eats with his high gluten intolerance.  He was originally misdiagnosed with Crohn's through Dr. Deatra Ina.  Patient last had a dilated eye exam two years ago.  Patient drinks about 125 oz of water per day.  Notes that he's had a headache behind his eyes the past few days.  Patient formerly noted chronic daily headaches.  His wife is doing well; she's running two hospitals through [employer] now, the one in [place] and the one in [place].  He thinks she'll be made Probation officer.  Denies issues with urination; states  that "I think the older I get, the more I stand, the more I have to pee."    Immunization History  Administered Date(s) Administered  . Influenza,inj,Quad PF,6+ Mos 01/22/2017    Health Maintenance  Topic Date Due  . TETANUS/TDAP  11/21/2017 (Originally [birth year]/01/1988)  . HIV Screening  08/25/2018 (Originally 05/01/1984)  . INFLUENZA VACCINE  11/12/2017  . COLONOSCOPY  06/25/2022      Wt Readings from Last 3 Encounters:  10/13/17 184 lb 11.2 oz (83.8 kg)  08/24/17 180 lb 3.2 oz (81.7 kg)  06/24/17 186 lb (84.4 kg)   BP Readings from Last 3 Encounters:  10/13/17 119/86  08/24/17 121/87  07/25/17 (!) 126/93   Pulse Readings from Last 3 Encounters:  10/13/17 81  08/24/17 78  07/25/17 98    Patient Active Problem List   Diagnosis Date Noted  . Atrial fibrillation with RVR ([place]) 03/27/2015    Priority: High  . Gouty arthropathy 08/24/2017    Priority: Medium  . Non-celiac gluten sensitivity 08/24/2017    Priority: Medium  . Mood disorder ([place]) 02/23/2014    Priority: Medium  . Environmental and seasonal allergies 08/24/2017  Priority: Low  . Family history of prostate cancer in father 08/24/2017    Priority: Low  . Chronic daily headache 02/23/2014    Priority: Low  . GERD 08/07/2009    Priority: Low  . ASTHMA 06/22/2007    Priority: Low  . Encounter for wellness examination 10/13/2017  . Screening for multiple conditions 08/24/2017  . Routine adult health maintenance 11/21/2016  . A-fib ([place]) 07/03/2015  . Gout 06/26/2015  . PAF (paroxysmal atrial fibrillation) ([place]) 06/26/2015  . Snoring 02/23/2014  . Vision changes 02/23/2014  . CROHN'S [facility] INTESTINE 08/07/2009  . WEIGHT LOSS-ABNORMAL 08/07/2009  . NAUSEA WITH VOMITING 08/07/2009  . NAUSEA ALONE 08/07/2009  . ABDOMINAL PAIN-PERIUMBILICAL 03/49/1791  . ABDOMINAL PAIN-EPIGASTRIC 08/07/2009  . ABDOMINAL PAIN, UPPER 08/07/2009  . ADHD 08/19/2007  . HAY FEVER 08/19/2007  . ALLERGIC RHINITIS  08/19/2007  . INGUINAL HERNIA, RIGHT 08/19/2007  . CROHN'S DISEASE 06/22/2007  . DYSPHAGIA UNSPECIFIED 06/22/2007  . BENIGN NEOPLASM [employer] SITE DIGESTIVE SYSTEM 03/01/2007  . ESOPHAGEAL STRICTURE 01/25/2007  . PAIN IN JOINT, ANKLE/FOOT 08/07/2006  . HALLUX RIGIDUS, ACQUIRED 08/07/2006  . HIATAL HERNIA 11/15/2005  . FOREIGN BODY IN ESOPHAGUS 11/15/2005    Past Medical History:  Diagnosis Date  . Allergy   . Arthritis   . Blood transfusion without reported diagnosis   . Crohn disease ([place])    in remission since 2000  . Paroxysmal atrial fibrillation ([facility])   . S/P balloon dilatation of esophageal stricture     Past Surgical History:  Procedure Laterality Date  . ABLATION    . CARDIOVERSION N/A 03/28/2015   Procedure: CARDIOVERSION;  Surgeon: Dorothy Spark, MD;  Location: [facility] ENDOSCOPY;  Service: Cardiovascular;  Laterality: N/A;  . ELECTROPHYSIOLOGIC STUDY N/A 07/03/2015   atrial fibrillation ablation by Dr Rayann Heman  . ESOPHAGEAL DILATION    . FRACTURE SURGERY    . TEE WITHOUT CARDIOVERSION N/A 03/28/2015   Procedure: TRANSESOPHAGEAL ECHOCARDIOGRAM (TEE);  Surgeon: Dorothy Spark, MD;  Location: [facility] ENDOSCOPY;  Service: Cardiovascular;  Laterality: N/A;    Family History  Problem Relation Age of Onset  . Heart disease Maternal Grandfather   . Heart disease Father        RBBB  . Prostate cancer Father   . Healthy Maternal Grandmother   . Healthy Paternal Grandmother   . Healthy Paternal Grandfather   . Prostate cancer Paternal Grandfather   . Healthy Mother   . Celiac disease Mother   . Stomach cancer Neg Hx   . Colon cancer Neg Hx     Social History   Substance and Sexual Activity  Drug Use No  ,  Social History   Substance and Sexual Activity  Alcohol Use Yes  . Alcohol/week: 0.0 oz   Comment: Occasionally  ,  Social History   Tobacco Use  Smoking Status Former Smoker  . Packs/day: 1.00  . Years: 2.00  . Pack years: 2.00  Smokeless Tobacco  Never Used  Tobacco Comment   quit in 1999, only smoked for 2 years  ,  Social History   Substance and Sexual Activity  Sexual Activity Yes    Patient's Medications  New Prescriptions   No medications on file  Previous Medications   FLUTICASONE (FLONASE) 50 MCG/ACT NASAL SPRAY    Place into both nostrils daily.   METOPROLOL SUCCINATE (TOPROL-XL) 25 MG 24 HR TABLET    TAKE 1&1/2 TABLETS (37.5 MG TOTAL) BY MOUTH DAILY.  Modified Medications   Modified Medication Previous Medication  ALLOPURINOL (ZYLOPRIM) 100 MG TABLET allopurinol (ZYLOPRIM) 100 MG tablet      Take 1 tablet (100 mg total) by mouth daily.    Take 1 tablet (100 mg total) by mouth daily.  Discontinued Medications   No medications on file    Other  Review of Systems: General:   Denies fever, chills, unexplained weight loss.  Optho/Auditory:   Denies visual changes, blurred vision/LOV Respiratory:   Denies SOB, DOE more than baseline levels.  Cardiovascular:   Denies chest pain, palpitations, new onset peripheral edema  Gastrointestinal:   Denies nausea, vomiting, diarrhea.  Genitourinary: Denies dysuria, freq/ urgency, flank pain or discharge from genitals.  Endocrine:     Denies hot or cold intolerance, polyuria, polydipsia. Musculoskeletal:   Denies unexplained myalgias, joint swelling, unexplained arthralgias, gait problems.  Skin:  Denies rash, suspicious lesions Neurological:     Denies dizziness, unexplained weakness, numbness  Psychiatric/Behavioral:   Denies mood changes, suicidal or homicidal ideations, hallucinations    Objective:     Blood pressure 119/86, pulse 81, height 6\' 1"  (1.854 m), weight 184 lb 11.2 oz (83.8 kg), SpO2 99 %. Body mass index is 24.37 kg/m. General Appearance:    Alert, cooperative, no distress, appears stated age  Head:    Normocephalic, without obvious abnormality, atraumatic  Eyes:    PERRL, conjunctiva/corneas clear, EOM's intact, fundi    benign, both eyes  Ears:     Normal TM's and external ear canals, both ears  Nose:   Nares normal, septum midline, mucosa normal, no drainage    or sinus tenderness  Throat:   Lips w/o lesion, mucosa moist, and tongue normal; teeth and   gums normal  Neck:   Supple, symmetrical, trachea midline, no adenopathy;    thyroid:  no enlargement/tenderness/nodules; no carotid   bruit or JVD  Back:     Symmetric, no curvature, ROM normal, no CVA tenderness  Lungs:     Clear to auscultation bilaterally, respirations unlabored, no       Wh/ R/ R  Chest Wall:    No tenderness or gross deformity; normal excursion   Heart:    Regular rate and rhythm, S1 and S2 normal, no murmur, rub   or gallop  Abdomen:     Soft, non-tender, bowel sounds active all four quadrants, NO   G/R/R, no masses, no organomegaly  Genitalia:   Ext genitalia: without lesion, no penile rash or discharge, no hernias appreciated   Rectal:   Normal tone, prostate WNL's and equal b/l, no tenderness; guaiac negative stool  Extremities:   Extremities normal, atraumatic, no cyanosis or gross edema  Pulses:   2+ and symmetric all extremities  Skin:   Warm, dry, Skin color, texture, turgor normal, no obvious rashes or lesions  M-Sk:   Ambulates * 4 w/o difficulty, no gross deformities, tone WNL  Neurologic:   CNII-XII intact, normal strength, sensation and reflexes    Throughout Psych:  No HI/SI, judgement and insight good, Euthymic mood. Full Affect.

## 2017-10-14 ENCOUNTER — Encounter: Payer: Self-pay | Admitting: Family Medicine

## 2017-10-14 ENCOUNTER — Other Ambulatory Visit: Payer: Self-pay

## 2017-10-14 DIAGNOSIS — M109 Gout, unspecified: Secondary | ICD-10-CM

## 2017-10-14 LAB — CBC WITH DIFFERENTIAL/PLATELET
BASOS ABS: 0 10*3/uL (ref 0.0–0.2)
Basos: 1 %
EOS (ABSOLUTE): 0.3 10*3/uL (ref 0.0–0.4)
EOS: 8 %
HEMATOCRIT: 42.7 % (ref 37.5–51.0)
HEMOGLOBIN: 15.2 g/dL (ref 13.0–17.7)
Immature Grans (Abs): 0 10*3/uL (ref 0.0–0.1)
Immature Granulocytes: 0 %
LYMPHS ABS: 1.1 10*3/uL (ref 0.7–3.1)
Lymphs: 29 %
MCH: 30.7 pg (ref 26.6–33.0)
MCHC: 35.6 g/dL (ref 31.5–35.7)
MCV: 86 fL (ref 79–97)
MONOS ABS: 0.5 10*3/uL (ref 0.1–0.9)
Monocytes: 14 %
Neutrophils Absolute: 1.8 10*3/uL (ref 1.4–7.0)
Neutrophils: 48 %
Platelets: 257 10*3/uL (ref 150–450)
RBC: 4.95 x10E6/uL (ref 4.14–5.80)
RDW: 12.4 % (ref 12.3–15.4)
WBC: 3.7 10*3/uL (ref 3.4–10.8)

## 2017-10-14 LAB — LIPID PANEL
CHOLESTEROL TOTAL: 174 mg/dL (ref 100–199)
Chol/HDL Ratio: 4.4 ratio (ref 0.0–5.0)
HDL: 40 mg/dL (ref >39)
LDL Calculated: 116 mg/dL — ABNORMAL HIGH (ref 0–99)
TRIGLYCERIDES: 90 mg/dL (ref 0–149)
VLDL Cholesterol Cal: 18 mg/dL (ref 5–40)

## 2017-10-14 LAB — COMPREHENSIVE METABOLIC PANEL
ALBUMIN: 4.4 g/dL (ref 3.5–5.5)
ALK PHOS: 58 IU/L (ref 39–117)
ALT: 22 IU/L (ref 0–44)
AST: 20 IU/L (ref 0–40)
Albumin/Globulin Ratio: 2.1 (ref 1.2–2.2)
BILIRUBIN TOTAL: 0.7 mg/dL (ref 0.0–1.2)
BUN / CREAT RATIO: 16 (ref 9–20)
BUN: 12 mg/dL (ref 6–24)
CHLORIDE: 106 mmol/L (ref 96–106)
CO2: 24 mmol/L (ref 20–29)
Calcium: 9.4 mg/dL (ref 8.7–10.2)
Creatinine, Ser: 0.74 mg/dL — ABNORMAL LOW (ref 0.76–1.27)
GFR calc non Af Amer: 109 mL/min/{1.73_m2} (ref >59)
GFR, EST AFRICAN AMERICAN: 126 mL/min/{1.73_m2} (ref >59)
GLOBULIN, TOTAL: 2.1 g/dL (ref 1.5–4.5)
Glucose: 96 mg/dL (ref 65–99)
Potassium: 5.3 mmol/L — ABNORMAL HIGH (ref 3.5–5.2)
SODIUM: 143 mmol/L (ref 134–144)
TOTAL PROTEIN: 6.5 g/dL (ref 6.0–8.5)

## 2017-10-14 LAB — HEMOGLOBIN A1C
Est. average glucose Bld gHb Est-mCnc: 100 mg/dL
Hgb A1c MFr Bld: 5.1 % (ref 4.8–5.6)

## 2017-10-14 LAB — VITAMIN D 25 HYDROXY (VIT D DEFICIENCY, FRACTURES): VIT D 25 HYDROXY: 29.7 ng/mL — AB (ref 30.0–100.0)

## 2017-10-14 LAB — PSA: Prostate Specific Ag, Serum: 0.4 ng/mL (ref 0.0–4.0)

## 2017-10-14 LAB — TSH: TSH: 1.56 u[IU]/mL (ref 0.450–4.500)

## 2017-10-14 LAB — T4, FREE: Free T4: 1.04 ng/dL (ref 0.82–1.77)

## 2017-10-14 NOTE — Telephone Encounter (Signed)
Opened in error

## 2017-11-15 DIAGNOSIS — M10072 Idiopathic gout, left ankle and foot: Secondary | ICD-10-CM | POA: Diagnosis not present

## 2017-12-02 DIAGNOSIS — Z00129 Encounter for routine child health examination without abnormal findings: Secondary | ICD-10-CM | POA: Diagnosis not present

## 2017-12-02 DIAGNOSIS — Z0110 Encounter for examination of ears and hearing without abnormal findings: Secondary | ICD-10-CM | POA: Diagnosis not present

## 2017-12-02 DIAGNOSIS — Z0100 Encounter for examination of eyes and vision without abnormal findings: Secondary | ICD-10-CM | POA: Diagnosis not present

## 2018-01-11 ENCOUNTER — Encounter: Payer: Self-pay | Admitting: Internal Medicine

## 2018-01-11 ENCOUNTER — Ambulatory Visit: Payer: BLUE CROSS/BLUE SHIELD | Admitting: Internal Medicine

## 2018-01-11 VITALS — BP 110/68 | HR 81 | Ht 72.0 in | Wt 180.6 lb

## 2018-01-11 DIAGNOSIS — I480 Paroxysmal atrial fibrillation: Secondary | ICD-10-CM | POA: Diagnosis not present

## 2018-01-11 NOTE — Patient Instructions (Signed)

## 2018-01-11 NOTE — Progress Notes (Signed)
   PCP: Mellody Dance, DO Primary Cardiologist: Dr Tamala Julian Primary EP: Dr Rayann Heman  Daniel Orozco is a 47 y.o. male who presents today for routine electrophysiology followup.  Since last being seen in our clinic, the patient reports doing very well.  He still works for [employer].  He has occasional elevations in heart rate noted on Apple Watch v 2.  Reports job related stress may be an issue.  His sons are 16 yo Daniel Orozco at [employer].  They no longer play soccer but are very active with golf and looking forward to college. Today, he denies symptoms of palpitations, chest pain, shortness of breath,  lower extremity edema, dizziness, presyncope, or syncope.  The patient is otherwise without complaint today.   Past Medical History:  Diagnosis Date  . Allergy   . Arthritis   . Blood transfusion without reported diagnosis   . Crohn disease ([place])    in remission since 2000  . Paroxysmal atrial fibrillation ([facility])   . S/P balloon dilatation of esophageal stricture    Past Surgical History:  Procedure Laterality Date  . ABLATION    . CARDIOVERSION N/A 03/28/2015   Procedure: CARDIOVERSION;  Surgeon: Dorothy Spark, MD;  Location: [facility] ENDOSCOPY;  Service: Cardiovascular;  Laterality: N/A;  . ELECTROPHYSIOLOGIC STUDY N/A 07/03/2015   atrial fibrillation ablation by Dr Rayann Heman  . ESOPHAGEAL DILATION    . FRACTURE SURGERY    . TEE WITHOUT CARDIOVERSION N/A 03/28/2015   Procedure: TRANSESOPHAGEAL ECHOCARDIOGRAM (TEE);  Surgeon: Dorothy Spark, MD;  Location: [facility] ENDOSCOPY;  Service: Cardiovascular;  Laterality: N/A;    ROS- all systems are reviewed and negatives except as per HPI above  Current Outpatient Medications  Medication Sig Dispense Refill  . allopurinol (ZYLOPRIM) 100 MG tablet Take 1 tablet (100 mg total) by mouth daily. 90 tablet 1  . fluticasone (FLONASE) 50 MCG/ACT nasal spray Place into both nostrils daily.    . metoprolol succinate (TOPROL-XL) 25 MG 24 hr tablet  TAKE 1&1/2 TABLETS (37.5 MG TOTAL) BY MOUTH DAILY. 135 tablet 2   Current Facility-Administered Medications  Medication Dose Route Frequency Provider Last Rate Last Dose  . 0.9 %  sodium chloride infusion  500 mL Intravenous Once Mauri Pole, MD        Physical Exam: Vitals:   01/11/18 0955  BP: 110/68  Pulse: 81  SpO2: 97%  Weight: 180 lb 9.6 oz (81.9 kg)  Height: 6' (1.829 m)    GEN- The patient is well appearing, alert and oriented x 3 today.   Head- normocephalic, atraumatic Eyes-  Sclera clear, conjunctiva pink Ears- hearing intact Oropharynx- clear Lungs- Clear to ausculation bilaterally, normal work of breathing Heart- Regular rate and rhythm, no murmurs, rubs or gallops, PMI not laterally displaced GI- soft, NT, ND, + BS Extremities- no clubbing, cyanosis, or edema  Wt Readings from Last 3 Encounters:  01/11/18 180 lb 9.6 oz (81.9 kg)  10/13/17 184 lb 11.2 oz (83.8 kg)  08/24/17 180 lb 3.2 oz (81.7 kg)    EKG tracing ordered today is personally reviewed and shows sinus rhythm 81 bpm, otherwise normal ekg  Assessment and Plan:  1. Paroxysmal atrial fibrillation Maintaining sinus rhythm off AAD therapy chads2vasc score is 0.  No OAC as per guidelines  Return in a year  [name] MD, [facility] 01/11/2018 10:14 AM

## 2018-01-24 ENCOUNTER — Other Ambulatory Visit: Payer: Self-pay | Admitting: Internal Medicine

## 2018-02-06 ENCOUNTER — Other Ambulatory Visit: Payer: Self-pay | Admitting: Family Medicine

## 2018-02-06 DIAGNOSIS — M109 Gout, unspecified: Secondary | ICD-10-CM

## 2018-03-09 ENCOUNTER — Other Ambulatory Visit: Payer: Self-pay | Admitting: Family Medicine

## 2018-03-09 DIAGNOSIS — M109 Gout, unspecified: Secondary | ICD-10-CM

## 2018-03-10 ENCOUNTER — Other Ambulatory Visit: Payer: Self-pay

## 2018-03-10 ENCOUNTER — Telehealth: Payer: Self-pay | Admitting: Family Medicine

## 2018-03-10 DIAGNOSIS — M109 Gout, unspecified: Secondary | ICD-10-CM

## 2018-03-10 MED ORDER — ALLOPURINOL 100 MG PO TABS: 100.0000 mg | ORAL_TABLET | Freq: Every day | ORAL | 1 refills | Status: DC

## 2018-03-10 NOTE — Telephone Encounter (Signed)
Patient called states pharmacy has told him provider denied his refill for this Rx : --- ( patient has only (5dys) left )  allopurinol (ZYLOPRIM) 100 MG tablet [[id number]]   Order Details  Dose: 100 mg Route: Oral Frequency: Daily  Dispense Quantity: 90 tablet Refills: 1 Fills remaining: --        Sig: Take 1 tablet (100 mg total) by mouth daily.          Forwarding request to medical assistant for review-- if approved for refill please send to :   [facility], [place] [phone] (Phone) [phone] (Fax)   glh

## 2018-03-10 NOTE — Telephone Encounter (Signed)
Per Dr. Raliegh Scarlet okay to refill.  Patient notified. MPulliam, CMA/RT(R)

## 2018-03-10 NOTE — Telephone Encounter (Signed)
LOV 10/13/17 and f/u 04/19/2017.  Patient states that he takes the medication daily and is requesting to know why it was denied.  Please review and advise if the patient should be doing something else for his gout of if he needs an office visit sooner. MPulliam, CMA/RT(R)

## 2018-03-10 NOTE — Telephone Encounter (Signed)
Who denied?

## 2018-04-15 ENCOUNTER — Ambulatory Visit: Payer: BLUE CROSS/BLUE SHIELD | Admitting: Family Medicine

## 2018-04-19 ENCOUNTER — Ambulatory Visit: Payer: BLUE CROSS/BLUE SHIELD | Admitting: Family Medicine

## 2018-05-14 DIAGNOSIS — M1000 Idiopathic gout, unspecified site: Secondary | ICD-10-CM | POA: Diagnosis not present

## 2018-07-17 IMAGING — DX DG FOOT COMPLETE 3+V*L*
3 series · 3 of 3 positions shown · non-contrast
Comparison: None.

CLINICAL DATA: Left foot pain and redness for 2 days.

EXAM:
LEFT FOOT - COMPLETE 3+ VIEW

[x foot ap left]
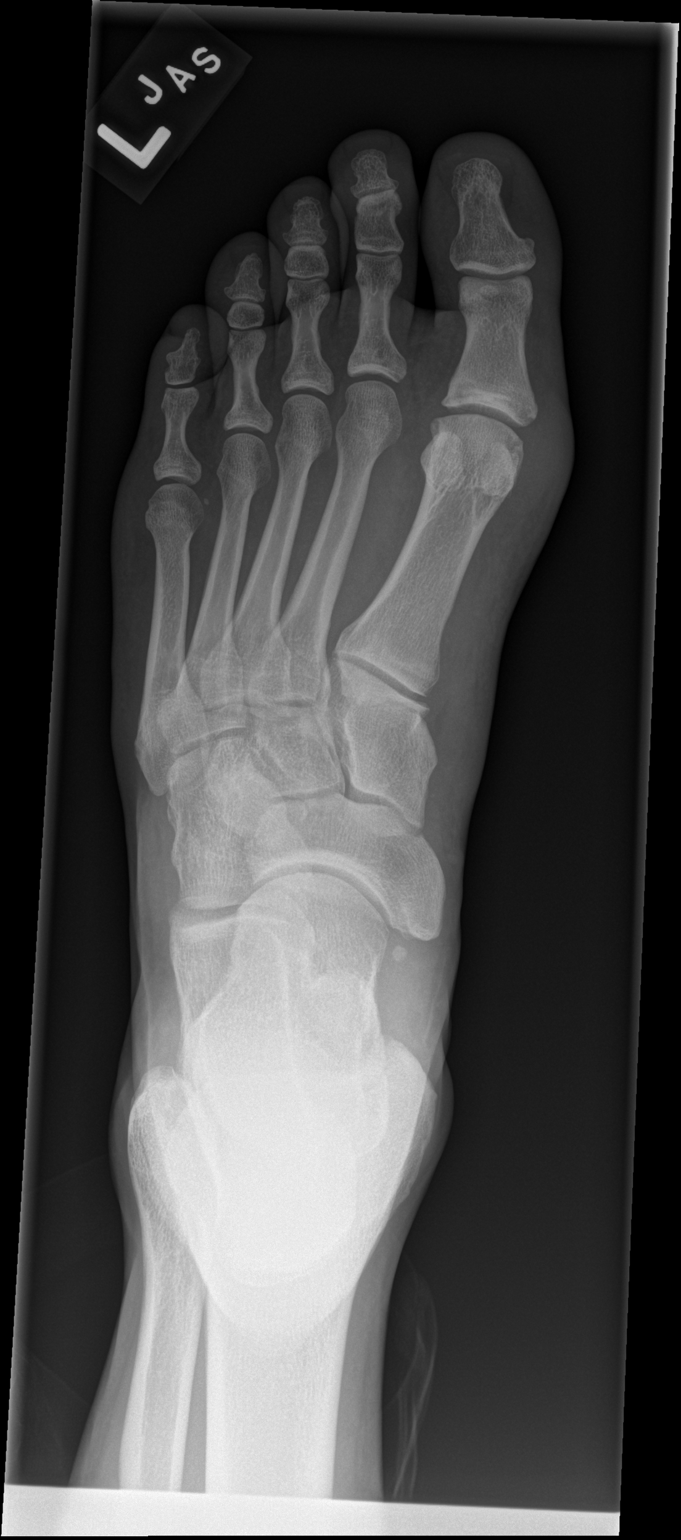

[x foot obl left]
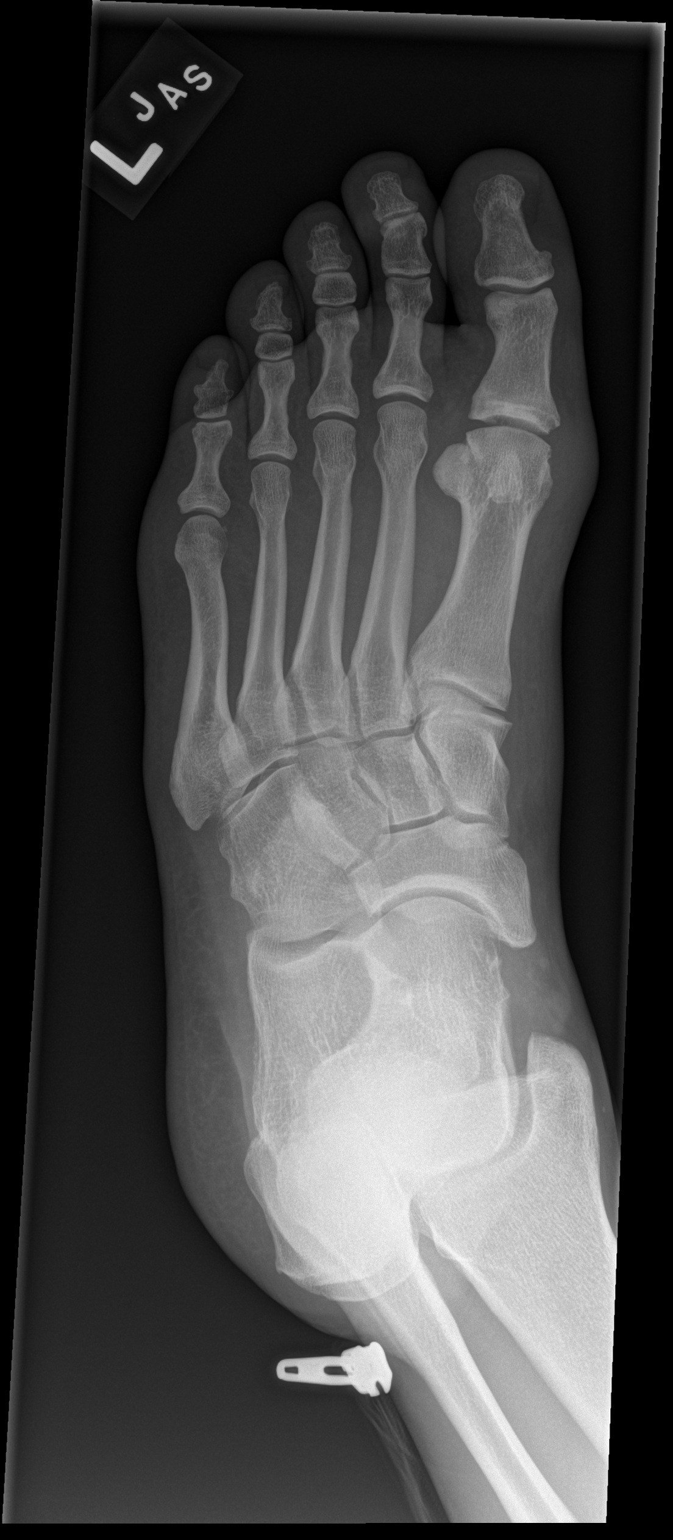

[x foot lat left]
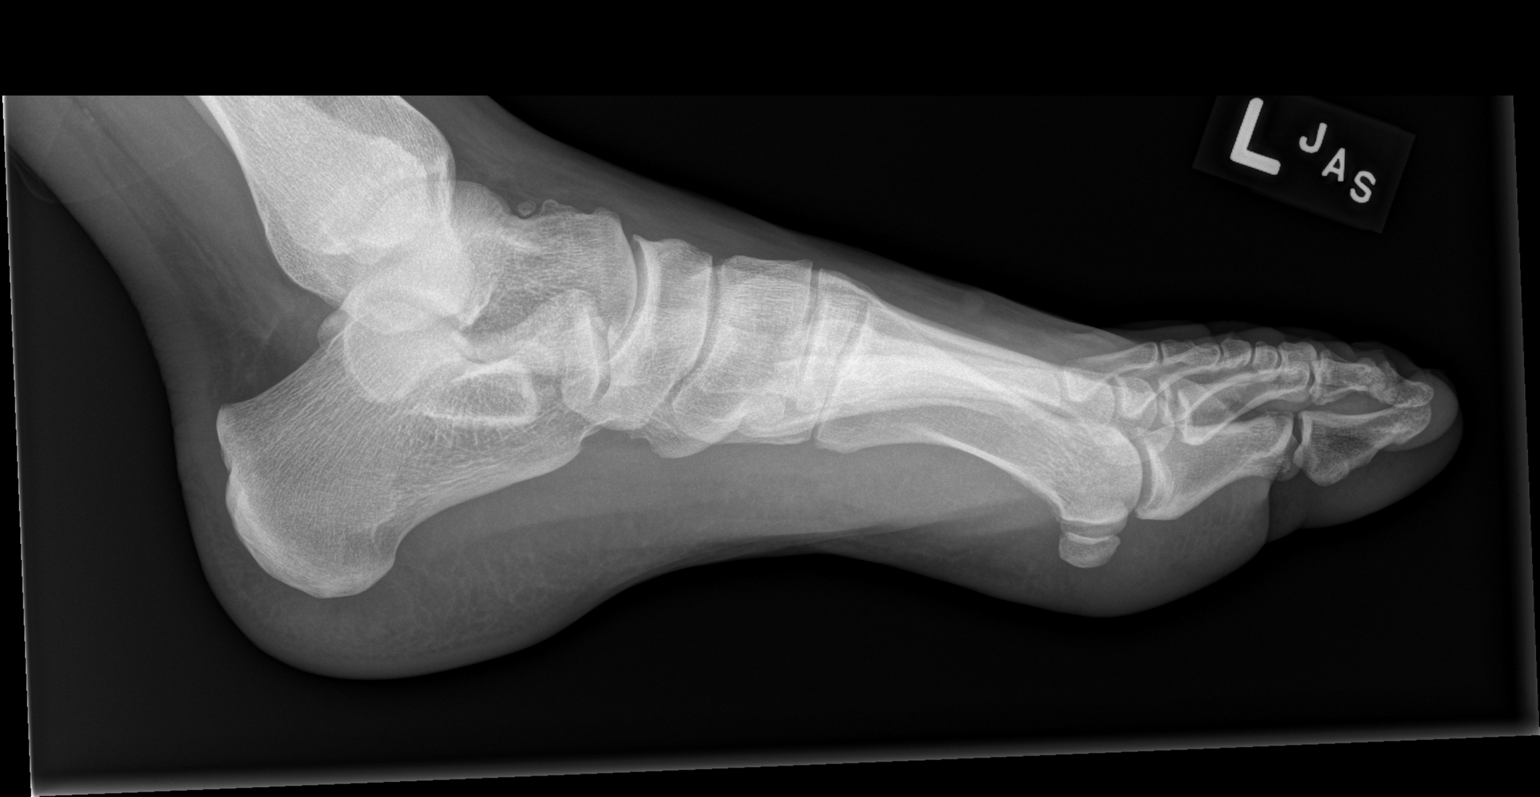

[3 of 3 positions shown; findings below may reference images not displayed]

FINDINGS: Frontal, oblique, and lateral views obtained. There is soft tissue
swelling medial to the first MTP joint. There are subtle erosions
along the medial aspect of the first MTP joint. Other joint spaces
appear normal. No periostitis. No fracture or dislocation.
IMPRESSION: Subtle erosion along the medial first MTP joint with soft tissue
swelling in this area. Suspect early changes of gout.

Other joint spaces appear normal.  No fracture or dislocation.

## 2018-08-18 ENCOUNTER — Other Ambulatory Visit: Payer: Self-pay | Admitting: Family Medicine

## 2018-08-18 DIAGNOSIS — M109 Gout, unspecified: Secondary | ICD-10-CM

## 2018-09-09 ENCOUNTER — Other Ambulatory Visit: Payer: Self-pay | Admitting: Family Medicine

## 2018-09-09 DIAGNOSIS — M109 Gout, unspecified: Secondary | ICD-10-CM

## 2018-09-23 ENCOUNTER — Ambulatory Visit (INDEPENDENT_AMBULATORY_CARE_PROVIDER_SITE_OTHER): Payer: BC Managed Care – PPO | Admitting: Family Medicine

## 2018-09-23 ENCOUNTER — Other Ambulatory Visit: Payer: Self-pay

## 2018-09-23 ENCOUNTER — Encounter: Payer: Self-pay | Admitting: Family Medicine

## 2018-09-23 VITALS — BP 121/72 | HR 92 | Ht 72.0 in | Wt 177.0 lb

## 2018-09-23 DIAGNOSIS — F39 Unspecified mood [affective] disorder: Secondary | ICD-10-CM

## 2018-09-23 DIAGNOSIS — R51 Headache: Secondary | ICD-10-CM

## 2018-09-23 DIAGNOSIS — I4891 Unspecified atrial fibrillation: Secondary | ICD-10-CM

## 2018-09-23 DIAGNOSIS — M109 Gout, unspecified: Secondary | ICD-10-CM

## 2018-09-23 DIAGNOSIS — R519 Headache, unspecified: Secondary | ICD-10-CM

## 2018-09-23 DIAGNOSIS — J3089 Other allergic rhinitis: Secondary | ICD-10-CM

## 2018-09-23 DIAGNOSIS — K219 Gastro-esophageal reflux disease without esophagitis: Secondary | ICD-10-CM

## 2018-09-23 MED ORDER — ALLOPURINOL 100 MG PO TABS: 100.0000 mg | ORAL_TABLET | Freq: Every day | ORAL | 1 refills | Status: DC

## 2018-09-23 NOTE — Progress Notes (Signed)
Telehealth office visit note for Daniel Orozco, D.O- at Primary Care at [facility]   I connected with current patient today and verified that I am speaking with the correct person using two identifiers.   . Location of the patient: Home . Location of the provider: Office Only the patient (+/- their family members at pt's discretion) and myself were participating in the encounter    - This visit type was conducted due to national recommendations for restrictions regarding the COVID-19 Pandemic (e.g. social distancing) in an effort to limit this patient's exposure and mitigate transmission in our community.  This format is felt to be most appropriate for this patient at this time.   - The patient did not have access to video technology or had technical difficulties with video requiring transitioning to audio format only. - No physical exam could be performed with this format, beyond that communicated to [place] by the patient/ family members as noted.   - Additionally my office staff/ schedulers discussed with the patient that there may be a monetary charge related to this service, depending on their medical insurance.   The patient expressed understanding, and agreed to proceed.       History of Present Illness:  - Pt and wife have been going to work regularly-they are wearing masks whenever out in public and he does not really have any concerns about COVID today.  Mood has been well.  They are tolerating the stressors of code well  - History of A-fib s/p ablation 07/03/2015 Dr. Rayann Heman  - patient has been using the app- heart watch- watches his HR-  Resting 82. - Denies symptoms and has been feeling well  - gout:   Stable, no problems.  No flare-ups  - gerd:   Well controlled, no concerns-  occasional OTC meds  - Seasonal allergies:   Taking flonase, have been very well controlled.  -Headaches: Have been well controlled, no complaints.  Only occasionally uses OTC meds.   Tries to  stay well hydrated and control stress    Impression and Recommendations:    1. Atrial fibrillation with RVR ([place])   2. Gouty arthropathy   3. Environmental and seasonal allergies   4. Chronic daily headache   5. Gastroesophageal reflux disease, esophagitis presence not specified   6. Mood disorder (HCC)     -Symptoms stable, meds per Dr. all read and cardiology -We will refill allopurinol.  This has kept acute flares at [place].  Patient tolerates well. -Seasonal allergies-well controlled.  Continue current treatment plan -Headaches: Not acute issue, symptoms stable -GERD: Has been well controlled.  Takes over-the-counter medicines occasionally, and will continue to try to avoid trigger foods -Mood disorder: History of anxiety depression and ADHD.  Symptoms well controlled no complaints. - As part of my medical decision making, I reviewed the following data within the [place] History obtained from pt /family, CMA notes reviewed and incorporated if applicable, Labs reviewed, Radiograph/ tests reviewed if applicable and OV notes from prior OV's with me, as well as other specialists she/he has seen since seeing me last, were all reviewed and used in my medical decision making process today.   - Additionally, discussion had with patient regarding txmnt plan, and their biases/concerns about that plan were used in my medical decision making today.   - The patient agreed with the plan and demonstrated an understanding of the instructions.   No barriers to understanding were identified.   -  Red flag symptoms and signs discussed in detail.  Patient expressed understanding regarding what to do in case of emergency\ urgent symptoms.  The patient was advised to call back or seek an in-person evaluation if the symptoms worsen or if the condition fails to improve as anticipated.   Return for FBW and CPE- due end of July '20. Told to make appt.    Meds ordered this encounter   Medications  . allopurinol (ZYLOPRIM) 100 MG tablet    Sig: Take 1 tablet (100 mg total) by mouth daily.    Dispense:  90 tablet    Refill:  1    Medications Discontinued During This Encounter  Medication Reason  . allopurinol (ZYLOPRIM) 100 MG tablet Reorder     I provided 16* minutes of non-face-to-face time during this encounter,with over 50% of the time in direct counseling on patients medical conditions/ medical concerns.  Additional time was spent with charting and coordination of care after the actual visit commenced.   Note:  This note was prepared with assistance of Dragon voice recognition software. Occasional wrong-word or sound-a-like substitutions may have occurred due to the inherent limitations of voice recognition software.  Daniel Dance, DO     Patient Care Team    Relationship Specialty Notifications Start End  Daniel Dance, DO PCP - General Family Medicine  08/24/17   [name], MD Consulting Physician Cardiology  08/24/17    Comment: EP Doc  Mauri Pole, MD Consulting Physician Gastroenterology  08/24/17   Belva Crome, MD Consulting Physician Cardiology  08/24/17      -Vitals obtained; medications/ allergies reconciled;  personal medical, social, Sx etc.histories were updated by CMA, reviewed by me and are reflected in chart   Patient Active Problem List   Diagnosis Date Noted  . Atrial fibrillation with RVR ([place]) 03/27/2015    Priority: High  . Gouty arthropathy 08/24/2017    Priority: Medium  . Non-celiac gluten sensitivity 08/24/2017    Priority: Medium  . Mood disorder ([place])-  history of anxiety depression and ADHD 02/23/2014    Priority: Medium  . Environmental and seasonal allergies 08/24/2017    Priority: Low  . Family history of prostate cancer in father 08/24/2017    Priority: Low  . Chronic daily headache 02/23/2014    Priority: Low  . GERD 08/07/2009    Priority: Low  . ASTHMA 06/22/2007    Priority: Low  .  Encounter for wellness examination 10/13/2017  . Screening for multiple conditions 08/24/2017  . Routine adult health maintenance 11/21/2016  . A-fib ([place]) 07/03/2015  . Gout 06/26/2015  . PAF (paroxysmal atrial fibrillation) ([place]) 06/26/2015  . Snoring 02/23/2014  . Vision changes 02/23/2014  . CROHN'S [facility] INTESTINE 08/07/2009  . WEIGHT LOSS-ABNORMAL 08/07/2009  . NAUSEA WITH VOMITING 08/07/2009  . NAUSEA ALONE 08/07/2009  . ABDOMINAL PAIN-PERIUMBILICAL 75/44/9201  . ABDOMINAL PAIN-EPIGASTRIC 08/07/2009  . ABDOMINAL PAIN, UPPER 08/07/2009  . ADHD 08/19/2007  . HAY FEVER 08/19/2007  . ALLERGIC RHINITIS 08/19/2007  . INGUINAL HERNIA, RIGHT 08/19/2007  . CROHN'S DISEASE 06/22/2007  . DYSPHAGIA UNSPECIFIED 06/22/2007  . BENIGN NEOPLASM [place] SITE DIGESTIVE SYSTEM 03/01/2007  . ESOPHAGEAL STRICTURE 01/25/2007  . PAIN IN JOINT, ANKLE/FOOT 08/07/2006  . HALLUX RIGIDUS, ACQUIRED 08/07/2006  . HIATAL HERNIA 11/15/2005  . FOREIGN BODY IN ESOPHAGUS 11/15/2005     Current Meds  Medication Sig  . allopurinol (ZYLOPRIM) 100 MG tablet Take 1 tablet (100 mg total) by mouth daily.  . fluticasone (FLONASE)  50 MCG/ACT nasal spray Place into both nostrils daily.  . metoprolol succinate (TOPROL-XL) 25 MG 24 hr tablet TAKE 1&1/2 TABLETS (37.5 MG TOTAL) BY MOUTH DAILY.  . [DISCONTINUED] allopurinol (ZYLOPRIM) 100 MG tablet TAKE 1 TABLET (100 MG TOTAL) BY MOUTH DAILY. NEEDS OFFICE VISIT FOR FURTHER REFILLS   Current Facility-Administered Medications for the 09/23/18 encounter (Office Visit) with Daniel Dance, DO  Medication  . 0.9 %  sodium chloride infusion     Allergies:  Allergies  Allergen Reactions  . Other Swelling    Honey dew melon and cantaloupe causes throat swelling      ROS:  See above HPI for pertinent positives and negatives   Objective:   Blood pressure 121/72, pulse 92, height 6' (1.829 m), weight 177 lb (80.3 kg).  (if some vitals are omitted, this  means that patient was UNABLE to obtain them even though they were asked to get them prior to OV today.  They were asked to call us at their earliest convenience with these once obtained. )  General: A & O * 3; sounds in no acute distress; in usual state of health.  Skin: Pt confirms warm and dry extremities and pink fingertips HEENT: Pt confirms lips non-cyanotic Chest: Patient confirms normal chest excursion and movement Respiratory: speaking in full sentences, no conversational dyspnea; patient confirms no use of accessory muscles Psych: insight appears good, mood- appears full

## 2019-01-06 ENCOUNTER — Telehealth: Payer: Self-pay

## 2019-01-10 NOTE — Telephone Encounter (Signed)
Spoke with pt regarding appt on 01/12/19. Pt stated he will check his vitals prior to his appt and did not have any questions at this time.

## 2019-01-12 ENCOUNTER — Encounter: Payer: Self-pay | Admitting: Internal Medicine

## 2019-01-12 ENCOUNTER — Telehealth (INDEPENDENT_AMBULATORY_CARE_PROVIDER_SITE_OTHER): Payer: BC Managed Care – PPO | Admitting: Internal Medicine

## 2019-01-12 VITALS — BP 124/85 | HR 85 | Ht 72.0 in | Wt 173.0 lb

## 2019-01-12 DIAGNOSIS — I480 Paroxysmal atrial fibrillation: Secondary | ICD-10-CM

## 2019-01-12 NOTE — Progress Notes (Signed)
Electrophysiology TeleHealth Note  Due to national recommendations of social distancing due to [place] 19, an audio telehealth visit is felt to be most appropriate for this patient at this time.  Verbal consent was obtained by me for the telehealth visit today.  Mychart video did not work.  A phone visit is therefore required today.   Date:  01/12/2019   ID:  Daniel Orozco, DOB 09-04-1970, MRN [id number]  Location: patient's home  Provider location:  [place]  Evaluation Performed: Follow-up visit  PCP:  Mellody Dance, DO   Electrophysiologist:  Dr Rayann Heman  Chief Complaint:  palpitations  History of Present Illness:    Daniel Orozco is a 48 y.o. male who presents via telehealth conferencing today. Since last being seen in our clinic, the patient reports doing very well.  His sons are playing golf and finishing their senior year of high school. They are looking at colleges with golf programs. Today, he denies symptoms of palpitations, chest pain, shortness of breath, lower extremity edema, dizziness, presyncope, or syncope.  The patient is otherwise without complaint today.  The patient denies symptoms of fevers, chills, cough, or new SOB worrisome for COVID 19.  Past Medical History:  Diagnosis Date  . Allergy   . Arthritis   . Blood transfusion without reported diagnosis   . Crohn disease ([place])    in remission since 2000  . Paroxysmal atrial fibrillation (HCC)   . S/P balloon dilatation of esophageal stricture     Past Surgical History:  Procedure Laterality Date  . ABLATION    . CARDIOVERSION N/A 03/28/2015   Procedure: CARDIOVERSION;  Surgeon: Dorothy Spark, MD;  Location[facility] ENDOSCOPY;  Service: Cardiovascular;  Laterality: N/A;  . ELECTROPHYSIOLOGIC STUDY N/A 07/03/2015   atrial fibrillation ablation by Dr Rayann Heman  . ESOPHAGEAL DILATION    . FRACTURE SURGERY    . TEE WITHOUT CARDIOVERSION N/A 03/28/2015   Procedure: TRANSESOPHAGEAL ECHOCARDIOGRAM  (TEE);  Surgeon: Dorothy Spark, MD;  Location: [facility] ENDOSCOPY;  Service: Cardiovascular;  Laterality: N/A;    Current Outpatient Medications  Medication Sig Dispense Refill  . allopurinol (ZYLOPRIM) 100 MG tablet Take 1 tablet (100 mg total) by mouth daily. 90 tablet 1  . fluticasone (FLONASE) 50 MCG/ACT nasal spray Place 1 spray into both nostrils as needed.     Marland Kitchen ibuprofen (ADVIL) 200 MG tablet Take 200 mg by mouth every 6 (six) hours as needed for mild pain.    . metoprolol succinate (TOPROL-XL) 25 MG 24 hr tablet TAKE 1&1/2 TABLETS (37.5 MG TOTAL) BY MOUTH DAILY. 135 tablet 3   Current Facility-Administered Medications  Medication Dose Route Frequency Provider Last Rate Last Dose  . 0.9 %  sodium chloride infusion  500 mL Intravenous Once Mauri Pole, MD        Allergies:   Other   Social History:  The patient  reports that he has quit smoking. He has a 2.00 pack-year smoking history. He has never used smokeless tobacco. He reports current alcohol use. He reports that he does not use drugs.   Family History:  The patient's family history includes Celiac disease in his mother; Healthy in his maternal grandmother, mother, paternal grandfather, and paternal grandmother; Heart disease in his father and maternal grandfather; Prostate cancer in his father and paternal grandfather.   ROS:  Please see the history of present illness.   All other systems are personally reviewed and negative.    Exam:  Vital Signs:  BP 124/85   Pulse 85   Ht 6' (1.829 m)   Wt 173 lb (78.5 kg)   BMI 23.46 kg/m   Well sounding, alert and conversant  Labs/Other Tests and Data Reviewed:    Recent Labs: No results found for requested labs within last 8760 hours.   Wt Readings from Last 3 Encounters:  01/12/19 173 lb (78.5 kg)  09/23/18 177 lb (80.3 kg)  01/11/18 180 lb 9.6 oz (81.9 kg)       ASSESSMENT & PLAN:    1.  Paroxysmal fibrillation  Doing great post ablation, without  recurrence off AAD therapy chads2vasc score is 0  Follow-up:  Return in a year   Patient Risk:  after full review of this patients clinical status, I feel that they are at moderate risk at this time.  Today, I have spent 15 minutes with the patient with telehealth technology discussing arrhythmia management .    SignedThompson Grayer, MD  01/12/2019 3:36 PM     [address] [place] [address] [phone] (office) [phone] (fax)

## 2019-01-17 ENCOUNTER — Ambulatory Visit: Payer: BLUE CROSS/BLUE SHIELD | Admitting: Internal Medicine

## 2019-02-26 ENCOUNTER — Other Ambulatory Visit: Payer: Self-pay | Admitting: Internal Medicine

## 2019-03-23 ENCOUNTER — Other Ambulatory Visit: Payer: Self-pay | Admitting: Family Medicine

## 2019-03-23 DIAGNOSIS — M109 Gout, unspecified: Secondary | ICD-10-CM

## 2019-11-20 ENCOUNTER — Telehealth: Payer: Self-pay | Admitting: Internal Medicine

## 2019-11-20 DIAGNOSIS — I480 Paroxysmal atrial fibrillation: Secondary | ICD-10-CM

## 2019-11-20 MED ORDER — METOPROLOL SUCCINATE ER 25 MG PO TB24: 25.0000 mg | ORAL_TABLET | Freq: Every day | ORAL | 2 refills | Status: DC

## 2019-11-20 MED ORDER — FLECAINIDE ACETATE 100 MG PO TABS: 300.0000 mg | ORAL_TABLET | Freq: Once | ORAL | 1 refills | Status: DC

## 2019-11-20 NOTE — Telephone Encounter (Signed)
The patient texted me this am with symptoms of palpitations.  He sent his Apple Watch transmission which confirms afib with RVR.  This is clearly new and <48 hours in onset.  He is out of toprol.  He has taken flecainide in the past without difficulty.   Plan: toprol 25mg  daily- 1st now Flecainide 300mg  PO x 1 Follow-up with me in the office tomorrow at 9:15 am  [name] MD, [facility] 11/20/2019 2:47 PM

## 2019-11-21 ENCOUNTER — Encounter: Payer: Self-pay | Admitting: Internal Medicine

## 2019-11-21 ENCOUNTER — Ambulatory Visit: Payer: BC Managed Care – PPO | Admitting: Internal Medicine

## 2019-11-21 ENCOUNTER — Other Ambulatory Visit: Payer: Self-pay

## 2019-11-21 VITALS — BP 106/82 | HR 79 | Ht 73.0 in | Wt 180.0 lb

## 2019-11-21 DIAGNOSIS — I480 Paroxysmal atrial fibrillation: Secondary | ICD-10-CM

## 2019-11-21 NOTE — Progress Notes (Signed)
   PCP: Lorrene Reid, PA-C   Primary EP: Dr Filbert Schilder is a 49 y.o. male who presents today for routine electrophysiology followup.  Since last being seen in our clinic, the patient reports doing very well.  He had afib this past weekend for which he took pill in pocket flecainide.  He has converted to sinus rhythm.  Today, he denies symptoms of palpitations, chest pain, shortness of breath,  lower extremity edema, dizziness, presyncope, or syncope.  The patient is otherwise without complaint today.   Past Medical History:  Diagnosis Date  . Allergy   . Arthritis   . Blood transfusion without reported diagnosis   . Crohn disease ([place])    in remission since 2000  . Paroxysmal atrial fibrillation (HCC)   . S/P balloon dilatation of esophageal stricture    Past Surgical History:  Procedure Laterality Date  . ABLATION    . CARDIOVERSION N/A 03/28/2015   Procedure: CARDIOVERSION;  Surgeon: Dorothy Spark, MD;  Location: [facility] ENDOSCOPY;  Service: Cardiovascular;  Laterality: N/A;  . ELECTROPHYSIOLOGIC STUDY N/A 07/03/2015   atrial fibrillation ablation by Dr Rayann Heman  . ESOPHAGEAL DILATION    . FRACTURE SURGERY    . TEE WITHOUT CARDIOVERSION N/A 03/28/2015   Procedure: TRANSESOPHAGEAL ECHOCARDIOGRAM (TEE);  Surgeon: Dorothy Spark, MD;  Location[facility] ENDOSCOPY;  Service: Cardiovascular;  Laterality: N/A;    ROS- all systems are reviewed and negatives except as per HPI above  Current Outpatient Medications  Medication Sig Dispense Refill  . metoprolol succinate (TOPROL-XL) 25 MG 24 hr tablet TAKE 1&1/2 TABLETS (37.5 MG TOTAL) BY MOUTH DAILY. 135 tablet 2  . flecainide (TAMBOCOR) 100 MG tablet Take 3 tablets (300 mg total) by mouth once for 1 dose. 12 tablet 1   Current Facility-Administered Medications  Medication Dose Route Frequency Provider Last Rate Last Admin  . 0.9 %  sodium chloride infusion  500 mL Intravenous Once Mauri Pole, MD        Physical  Exam: Vitals:   11/21/19 0941  BP: 106/82  Pulse: 79  SpO2: 97%  Weight: 180 lb (81.6 kg)  Height: 6\' 1"  (1.854 m)    GEN- The patient is well appearing, alert and oriented x 3 today.   Head- normocephalic, atraumatic Eyes-  Sclera clear, conjunctiva pink Ears- hearing intact Oropharynx- clear Lungs-   normal work of breathing Heart- Regular rate and rhythm  GI- soft  Extremities- no clubbing, cyanosis, or edema  Wt Readings from Last 3 Encounters:  11/21/19 180 lb (81.6 kg)  01/12/19 173 lb (78.5 kg)  09/23/18 177 lb (80.3 kg)    EKG tracing ordered today is personally reviewed and shows sinus  Assessment and Plan:  1. Paroxysmal atrial fibrillation He has done well since ablation in 2016.  He had afib this past weekend which converted with pill in pocket flecainide chads2vasc score is 0. No changes  Risks, benefits and potential toxicities for medications prescribed and/or refilled reviewed with patient today.   Return in a year unless problems arise  [name] MD, [facility] 11/21/2019 9:49 AM

## 2019-11-21 NOTE — Patient Instructions (Signed)
Medication Instructions:  Your physician recommends that you continue on your current medications as directed. Please refer to the Current Medication list given to you today.  *If you need a refill on your cardiac medications before your next appointment, please call your pharmacy*  Lab Work: None ordered.  If you have labs (blood work) drawn today and your tests are completely normal, you will receive your results only by: . MyChart Message (if you have MyChart) OR . A paper copy in the mail If you have any lab test that is abnormal or we need to change your treatment, we will call you to review the results.  Testing/Procedures: None ordered.  Follow-Up: At [facility], you and your health needs are our priority.  As part of our continuing mission to provide you with exceptional heart care, we have created designated Provider Care Teams.  These Care Teams include your primary Cardiologist (physician) and Advanced Practice Providers (APPs -  Physician Assistants and Nurse Practitioners) who all work together to provide you with the care you need, when you need it.  We recommend signing up for the patient portal called "MyChart".  Sign up information is provided on this After Visit Summary.  MyChart is used to connect with patients for Virtual Visits (Telemedicine).  Patients are able to view lab/test results, encounter notes, upcoming appointments, etc.  Non-urgent messages can be sent to your provider as well.   To learn more about what you can do with MyChart, go to [email or web address].    Your next appointment:   Your physician wants you to follow-up in: 1 year with Dr. Allred. You will receive a reminder letter in the mail two months in advance. If you don't receive a letter, please call our office to schedule the follow-up appointment.   Other Instructions:  

## 2019-12-31 ENCOUNTER — Other Ambulatory Visit: Payer: Self-pay

## 2019-12-31 ENCOUNTER — Other Ambulatory Visit: Payer: BC Managed Care – PPO

## 2019-12-31 DIAGNOSIS — Z20822 Contact with and (suspected) exposure to covid-19: Secondary | ICD-10-CM

## 2020-01-01 DIAGNOSIS — Z20822 Contact with and (suspected) exposure to covid-19: Secondary | ICD-10-CM | POA: Diagnosis not present

## 2020-01-03 LAB — NOVEL CORONAVIRUS, NAA: SARS-CoV-2, NAA: NOT DETECTED

## 2020-01-03 LAB — SPECIMEN STATUS REPORT

## 2020-01-03 LAB — SARS-COV-2, NAA 2 DAY TAT

## 2020-01-23 DIAGNOSIS — L638 Other alopecia areata: Secondary | ICD-10-CM | POA: Diagnosis not present

## 2020-03-19 DIAGNOSIS — R030 Elevated blood-pressure reading, without diagnosis of hypertension: Secondary | ICD-10-CM | POA: Diagnosis not present

## 2020-03-19 DIAGNOSIS — M109 Gout, unspecified: Secondary | ICD-10-CM | POA: Diagnosis not present

## 2020-04-19 DIAGNOSIS — M109 Gout, unspecified: Secondary | ICD-10-CM | POA: Diagnosis not present

## 2020-04-19 DIAGNOSIS — R030 Elevated blood-pressure reading, without diagnosis of hypertension: Secondary | ICD-10-CM | POA: Diagnosis not present

## 2020-04-22 ENCOUNTER — Other Ambulatory Visit: Payer: Self-pay

## 2020-04-22 ENCOUNTER — Emergency Department (HOSPITAL_COMMUNITY)
Admission: EM | Admit: 2020-04-22 | Discharge: 2020-04-22 | Disposition: A | Discharge status: Home / Self Care | Payer: BC Managed Care – PPO | Attending: Emergency Medicine | Admitting: Emergency Medicine

## 2020-04-22 DIAGNOSIS — Z87891 Personal history of nicotine dependence: Secondary | ICD-10-CM | POA: Diagnosis not present

## 2020-04-22 DIAGNOSIS — I480 Paroxysmal atrial fibrillation: Secondary | ICD-10-CM | POA: Diagnosis not present

## 2020-04-22 DIAGNOSIS — Y9389 Activity, other specified: Secondary | ICD-10-CM | POA: Insufficient documentation

## 2020-04-22 DIAGNOSIS — T23002A Burn of unspecified degree of left hand, unspecified site, initial encounter: Secondary | ICD-10-CM | POA: Diagnosis not present

## 2020-04-22 DIAGNOSIS — T23231A Burn of second degree of multiple right fingers (nail), not including thumb, initial encounter: Secondary | ICD-10-CM | POA: Diagnosis not present

## 2020-04-22 DIAGNOSIS — T23232A Burn of second degree of multiple left fingers (nail), not including thumb, initial encounter: Secondary | ICD-10-CM | POA: Diagnosis not present

## 2020-04-22 DIAGNOSIS — T310 Burns involving less than 10% of body surface: Secondary | ICD-10-CM | POA: Diagnosis not present

## 2020-04-22 DIAGNOSIS — X16XXXA Contact with hot heating appliances, radiators and pipes, initial encounter: Secondary | ICD-10-CM | POA: Insufficient documentation

## 2020-04-22 DIAGNOSIS — T23202A Burn of second degree of left hand, unspecified site, initial encounter: Secondary | ICD-10-CM | POA: Diagnosis not present

## 2020-04-22 DIAGNOSIS — J45909 Unspecified asthma, uncomplicated: Secondary | ICD-10-CM | POA: Insufficient documentation

## 2020-04-22 DIAGNOSIS — T23299A Burn of second degree of multiple sites of unspecified wrist and hand, initial encounter: Secondary | ICD-10-CM

## 2020-04-22 DIAGNOSIS — T23201A Burn of second degree of right hand, unspecified site, initial encounter: Secondary | ICD-10-CM | POA: Diagnosis not present

## 2020-04-22 LAB — CBC WITH DIFFERENTIAL/PLATELET
Abs Immature Granulocytes: 0.02 10*3/uL (ref 0.00–0.07)
Basophils Absolute: 0.1 10*3/uL (ref 0.0–0.1)
Basophils Relative: 1 %
Eosinophils Absolute: 0.2 10*3/uL (ref 0.0–0.5)
Eosinophils Relative: 2 %
HCT: 41.4 % (ref 39.0–52.0)
Hemoglobin: 15.1 g/dL (ref 13.0–17.0)
Immature Granulocytes: 0 %
Lymphocytes Relative: 22 %
Lymphs Abs: 1.4 10*3/uL (ref 0.7–4.0)
MCH: 30.9 pg (ref 26.0–34.0)
MCHC: 36.5 g/dL — ABNORMAL HIGH (ref 30.0–36.0)
MCV: 84.8 fL (ref 80.0–100.0)
Monocytes Absolute: 0.7 10*3/uL (ref 0.1–1.0)
Monocytes Relative: 11 %
Neutro Abs: 4.2 10*3/uL (ref 1.7–7.7)
Neutrophils Relative %: 64 %
Platelets: 276 10*3/uL (ref 150–400)
RBC: 4.88 MIL/uL (ref 4.22–5.81)
RDW: 11 % — ABNORMAL LOW (ref 11.5–15.5)
WBC: 6.5 10*3/uL (ref 4.0–10.5)
nRBC: 0 % (ref 0.0–0.2)

## 2020-04-22 LAB — BASIC METABOLIC PANEL
Anion gap: 11 (ref 5–15)
BUN: 10 mg/dL (ref 6–20)
CO2: 23 mmol/L (ref 22–32)
Calcium: 9.4 mg/dL (ref 8.9–10.3)
Chloride: 105 mmol/L (ref 98–111)
Creatinine, Ser: 0.72 mg/dL (ref 0.61–1.24)
GFR, Estimated: >60 mL/min (ref >60)
Glucose, Bld: 110 mg/dL — ABNORMAL HIGH (ref 70–99)
Potassium: 3.7 mmol/L (ref 3.5–5.1)
Sodium: 139 mmol/L (ref 135–145)

## 2020-04-22 MED ORDER — LACTATED RINGERS IV BOLUS
1000.0000 mL | Freq: Once | INTRAVENOUS | Status: AC
  Administered 2020-04-22: 1000 mL via INTRAVENOUS

## 2020-04-22 MED ORDER — FENTANYL CITRATE (PF) 100 MCG/2ML IJ SOLN
70.0000 ug | Freq: Once | INTRAMUSCULAR | Status: AC
  Administered 2020-04-22: 70 ug via INTRAVENOUS
  Filled 2020-04-22: qty 2

## 2020-04-22 MED ORDER — AMOXICILLIN-POT CLAVULANATE 500-125 MG PO TABS: 1.0000 | ORAL_TABLET | Freq: Two times a day (BID) | ORAL | 0 refills | Status: AC
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
## 2020-04-22 NOTE — Discharge Instructions (Addendum)
Thank you for choosing [facility] for your care today.  To do: Call [phone] as below to set up a 1-2 week follow up visit.  For wound care, change your dressing and wash your hands once every 24 hours until your follow up appointment. You should apply these in the following order: - apply bacitracin ointment to areas where blisters were removed - apply a layer of xeroform gauze (can pick up more at the pharmacy) - apply a dry dressing (layer of gauze or non-adherent pad) over the greasy layers - wrap with gauze to protect the area. I have included a prescription for antibiotics which you can pick up at your pharmacy of choice. Please come back to the ED if you start to have fevers, if you have spreading redness or pus around the wounds, or if you have worsening stiffness or pain in your finger joints with trouble moving your hands. Please follow up with your primary care doctor in the next few days. If you do not have a PCP you are established with, you can call [phone]  or [phone]  to access [facility] service. You can also visit [email or web address] Please come back to the Emergency Department if you have shortness of breath, chest pain, confusion/mental status changes, if you have so much nausea/vomiting that you cannot keep down fluids, or if you have any other symptoms that worry you.  Take care. Hope you start feeling better soon.  Vanna Scotland, MD Emergency Medicine

## 2020-04-23 ENCOUNTER — Telehealth: Payer: Self-pay | Admitting: Physician Assistant

## 2020-04-23 MED ORDER — SILVER SULFADIAZINE 1 % EX CREA: 1.0000 "application " | TOPICAL_CREAM | Freq: Every day | CUTANEOUS | 0 refills | Status: DC

## 2020-04-23 NOTE — Telephone Encounter (Signed)
Silvadine sent to pharmacy per [facility]. Patient has apt Wednesday to follow up from ED. AS, CMA

## 2020-04-24 DIAGNOSIS — L638 Other alopecia areata: Secondary | ICD-10-CM | POA: Diagnosis not present

## 2020-04-25 ENCOUNTER — Ambulatory Visit (INDEPENDENT_AMBULATORY_CARE_PROVIDER_SITE_OTHER): Payer: BC Managed Care – PPO | Admitting: Physician Assistant

## 2020-04-25 ENCOUNTER — Other Ambulatory Visit: Payer: Self-pay

## 2020-04-25 ENCOUNTER — Encounter: Payer: Self-pay | Admitting: Physician Assistant

## 2020-04-25 VITALS — BP 136/87 | HR 80 | Ht 73.0 in | Wt 178.1 lb

## 2020-04-25 DIAGNOSIS — T23299A Burn of second degree of multiple sites of unspecified wrist and hand, initial encounter: Secondary | ICD-10-CM | POA: Diagnosis not present

## 2020-04-25 NOTE — Patient Instructions (Addendum)
Burn Care, Adult A burn is an injury to the skin or the tissues under the skin. There are three types of burns:  First degree. These burns may cause the skin to be red and a bit swollen.  Second degree. These burns are very painful and cause the skin to be very red. The skin may also swell, leak fluid, look shiny, and start to have blisters.  Third degree. These burns cause lasting damage. They turn the skin white or black and make it look charred, dry, and leathery. Treatment for your burn will depend on the type of burn you have. Taking good care of your burn can help to prevent pain and infection. It can also help the burn heal quickly. How to care for a first-degree burn Right after a burn:  Rinse or soak the burn under cool water for 5 minutes or more. Do not put ice on your burn. That can cause more damage.  Put a cool, clean, wet cloth on your burn.  Put lotion or gel with aloe vera on your burn. Caring for the burn Clean and care for your burn. Your doctor may tell you:  To clean the burn using soap and water.  To pat the burn dry using a clean cloth. Do not rub or scrub the burn.  To put lotion or gel with aloe vera on your burn. How to care for a second-degree burn Right after a burn:  Rinse or soak the burn under cool water. Do this for 5 to 10 minutes. Do not put ice on your burn. This can cause more damage.  Remove any jewelry near the burned area.  Cover the burn with a clean cloth. Caring for the burn  Raise (elevate) the burned area above the level of your heart while sitting or lying down.  Clean and care for your burn. Your doctor may tell you: ? To clean or rinse your burn. ? To put a cream or ointment on the burn. ? To place a germ-free (sterile) dressing over the burn. A dressing is a material that is placed on a burn to help it heal. How to care for a third-degree burn Right after a burn:  Cover the burn with a clean, dry cloth.  Seek treatment  right away if you have this kind of burn. You may: ? Need to stay in the hospital. ? Have surgery to remove burned tissue. ? Have surgery to put new skin on the burned area. ? Be given fluids through an IV tube. Caring for the burn Clean and care for your burn. Your doctor may tell you:  To clean or rinse your burn.  To put a cream or ointment on the burn.  To put a sterile dressing in the burn. This is called packing.  To place a sterile dressing over the burn. Other things to do  Raise the burned area above the level of your heart while sitting or lying down.  Wear splints or immobilizers if told by your doctor.  Rest as told by your doctor. Do not do sports or other activities until your doctor approves. How to prevent infection when caring for a burn  Take these steps to prevent infection: ? Wash your hands with soap and water for at least 20 seconds before and after caring for your burn. If you cannot use soap and water, use hand sanitizer. ? Wear clean or sterile gloves as told by your doctor. ? Do not put butter, oil,   toothpaste, or other home remedies on the burn. ? Do not scratch or pick at the burn. ? Do not break any blisters. ? Do not peel the skin. ? Do not rub your burn, even when you are cleaning it.  Check your burn every day for these signs of infection: ? More redness, swelling, or pain. ? Warmth. ? Pus or a bad smell. ? Red streaks around the burn.   Follow these instructions at home Medicines  Take over-the-counter and prescription medicines only as told by your doctor.  If you were prescribed an antibiotic medicine, use it as told by your doctor. Do not stop using the antibiotic even if your condition gets better.  Your doctor may ask you to take medicine for pain before you change your dressing. General instructions  Protect your burn from the sun.  Drink enough fluid to keep your pee (urine) pale yellow.  Do not use any products that contain  nicotine or tobacco, such as cigarettes, e-cigarettes, and chewing tobacco. These can delay healing. If you need help quitting, ask your doctor.  Keep all follow-up visits as told by your doctor. This is important.   Contact a doctor if:  Your condition does not get better.  Your condition gets worse.  You have a fever or chills.  Your burn feels warm to the touch.  You have more redness, swelling, or pain on your burn.  Your burn looks different or starts to have black or red spots on it.  Your pain does not get better with medicine. Get help right away if:  You have more fluid, blood, or pus coming from your burn.  You have red streaks near the burn.  You have very bad pain. Summary  There are three types of burns. They are first degree, second degree, and third degree. Of these, a third-degree burn is most serious. This must be treated right away.  Treatment for your burn will depend on the type of burn you have.  Do not put butter, oil, toothpaste, or other home remedies on the burn. These things can damage your skin.  Follow instructions from your doctor about how to clean and take care of your burn. This information is not intended to replace advice given to you by your health care provider. Make sure you discuss any questions you have with your health care provider. Document Revised: 05/20/2019 Document Reviewed: 01/18/2019 Elsevier Patient Education  2021 [employer]  
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
## 2020-05-02 DIAGNOSIS — Z1152 Encounter for screening for COVID-19: Secondary | ICD-10-CM | POA: Diagnosis not present

## 2020-05-02 DIAGNOSIS — R0981 Nasal congestion: Secondary | ICD-10-CM | POA: Diagnosis not present

## 2020-05-07 ENCOUNTER — Telehealth: Payer: Self-pay | Admitting: Physician Assistant

## 2020-05-07 NOTE — Telephone Encounter (Signed)
Patient advised of the below and verbalized understanding. AS, CMA 
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
## 2020-05-14 DIAGNOSIS — T23201A Burn of second degree of right hand, unspecified site, initial encounter: Secondary | ICD-10-CM | POA: Diagnosis not present

## 2020-05-14 DIAGNOSIS — T23292A Burn of second degree of multiple sites of left wrist and hand, initial encounter: Secondary | ICD-10-CM | POA: Diagnosis not present

## 2020-09-18 ENCOUNTER — Encounter: Payer: Self-pay | Admitting: Physician Assistant

## 2020-09-18 ENCOUNTER — Ambulatory Visit (INDEPENDENT_AMBULATORY_CARE_PROVIDER_SITE_OTHER): Payer: BC Managed Care – PPO | Admitting: Physician Assistant

## 2020-09-18 ENCOUNTER — Other Ambulatory Visit: Payer: Self-pay

## 2020-09-18 VITALS — BP 125/87 | HR 90 | Temp 97.3°F | Ht 73.0 in | Wt 182.1 lb

## 2020-09-18 DIAGNOSIS — M109 Gout, unspecified: Secondary | ICD-10-CM

## 2020-09-18 DIAGNOSIS — I4891 Unspecified atrial fibrillation: Secondary | ICD-10-CM | POA: Diagnosis not present

## 2020-09-18 MED ORDER — COLCHICINE 0.6 MG PO TABS: ORAL_TABLET | ORAL | 0 refills | Status: DC
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
## 2020-09-18 NOTE — Patient Instructions (Signed)
Gout  Gout is painful swelling of your joints. Gout is a type of arthritis. It is caused by having too much uric acid in your body. Uric acid is a chemical that is made when your body breaks down substances called purines. If your body has too much uric acid, sharp crystals can form and build up in your joints. This causes pain and swelling. Gout attacks can happen quickly and be very painful (acute gout). Over time, the attacks can affect more joints and happen more often (chronic gout). What are the causes?  Too much uric acid in your blood. This can happen because: ? Your kidneys do not remove enough uric acid from your blood. ? Your body makes too much uric acid. ? You eat too many foods that are high in purines. These foods include organ meats, some seafood, and beer.  Trauma or stress. What increases the risk?  Having a family history of gout.  Being male and middle-aged.  Being male and having gone through menopause.  Being very overweight (obese).  Drinking alcohol, especially beer.  Not having enough water in the body (being dehydrated).  Losing weight too quickly.  Having an organ transplant.  Having lead poisoning.  Taking certain medicines.  Having kidney disease.  Having a skin condition called psoriasis. What are the signs or symptoms? An attack of acute gout usually happens in just one joint. The most common place is the big toe. Attacks often start at night. Other joints that may be affected include joints of the feet, ankle, knee, fingers, wrist, or elbow. Symptoms of an attack may include:  Very bad pain.  Warmth.  Swelling.  Stiffness.  Shiny, red, or purple skin.  Tenderness. The affected joint may be very painful to touch.  Chills and fever. Chronic gout may cause symptoms more often. More joints may be involved. You may also have white or yellow lumps (tophi) on your hands or feet or in other areas near your joints.   How is this  treated?  Treatment for this condition has two phases: treating an acute attack and preventing future attacks.  Acute gout treatment may include: ? NSAIDs. ? Steroids. These are taken by mouth or injected into a joint. ? Colchicine. This medicine relieves pain and swelling. It can be given by mouth or through an IV tube.  Preventive treatment may include: ? Taking small doses of NSAIDs or colchicine daily. ? Using a medicine that reduces uric acid levels in your blood. ? Making changes to your diet. You may need to see a food expert (dietitian) about what to eat and drink to prevent gout. Follow these instructions at home: During a gout attack  If told, put ice on the painful area: ? Put ice in a plastic bag. ? Place a towel between your skin and the bag. ? Leave the ice on for 20 minutes, 2-3 times a day.  Raise (elevate) the painful joint above the level of your heart as often as you can.  Rest the joint as much as possible. If the joint is in your leg, you may be given crutches.  Follow instructions from your doctor about what you cannot eat or drink.   Avoiding future gout attacks  Eat a low-purine diet. Avoid foods and drinks such as: ? Liver. ? Kidney. ? Anchovies. ? Asparagus. ? Herring. ? Mushrooms. ? Mussels. ? Beer.  Stay at a healthy weight. If you want to lose weight, talk with your doctor. Do   not lose weight too fast.  Start or continue an exercise plan as told by your doctor. Eating and drinking  Drink enough fluids to keep your pee (urine) pale yellow.  If you drink alcohol: ? Limit how much you use to:  0-1 drink a day for women.  0-2 drinks a day for men. ? Be aware of how much alcohol is in your drink. In the [place] one drink equals one 12 oz bottle of beer (355 mL), one 5 oz glass of wine (148 mL), or one 1 oz glass of hard liquor (44 mL). General instructions  Take over-the-counter and prescription medicines only as told by your doctor.  Do  not drive or use heavy machinery while taking prescription pain medicine.  Return to your normal activities as told by your doctor. Ask your doctor what activities are safe for you.  Keep all follow-up visits as told by your doctor. This is important. Contact a doctor if:  You have another gout attack.  You still have symptoms of a gout attack after 10 days of treatment.  You have problems (side effects) because of your medicines.  You have chills or a fever.  You have burning pain when you pee (urinate).  You have pain in your lower back or belly. Get help right away if:  You have very bad pain.  Your pain cannot be controlled.  You cannot pee. Summary  Gout is painful swelling of the joints.  The most common site of pain is the big toe, but it can affect other joints.  Medicines and avoiding some foods can help to prevent and treat gout attacks. This information is not intended to replace advice given to you by your health care provider. Make sure you discuss any questions you have with your health care provider. Document Revised: 10/21/2017 Document Reviewed: 10/21/2017 Elsevier Patient Education  2021 [employer]  

## 2020-10-30 ENCOUNTER — Other Ambulatory Visit: Payer: Self-pay | Admitting: Physician Assistant

## 2020-10-30 DIAGNOSIS — M109 Gout, unspecified: Secondary | ICD-10-CM

## 2020-12-24 ENCOUNTER — Other Ambulatory Visit: Payer: Self-pay | Admitting: Physician Assistant

## 2020-12-24 DIAGNOSIS — M109 Gout, unspecified: Secondary | ICD-10-CM

## 2021-01-28 ENCOUNTER — Other Ambulatory Visit: Payer: Self-pay | Admitting: Physician Assistant

## 2021-01-28 ENCOUNTER — Other Ambulatory Visit: Payer: Self-pay | Admitting: *Deleted

## 2021-01-28 DIAGNOSIS — M79662 Pain in left lower leg: Secondary | ICD-10-CM | POA: Diagnosis not present

## 2021-01-28 DIAGNOSIS — I82409 Acute embolism and thrombosis of unspecified deep veins of unspecified lower extremity: Secondary | ICD-10-CM | POA: Diagnosis not present

## 2021-01-29 ENCOUNTER — Other Ambulatory Visit: Payer: Self-pay | Admitting: Physician Assistant

## 2021-01-29 DIAGNOSIS — I82402 Acute embolism and thrombosis of unspecified deep veins of left lower extremity: Secondary | ICD-10-CM

## 2021-02-03 ENCOUNTER — Ambulatory Visit: Payer: BC Managed Care – PPO

## 2021-02-03 DIAGNOSIS — M25511 Pain in right shoulder: Secondary | ICD-10-CM | POA: Diagnosis not present

## 2021-02-15 DIAGNOSIS — M25511 Pain in right shoulder: Secondary | ICD-10-CM | POA: Diagnosis not present

## 2021-03-06 DIAGNOSIS — M25511 Pain in right shoulder: Secondary | ICD-10-CM | POA: Diagnosis not present

## 2021-03-13 DIAGNOSIS — M25511 Pain in right shoulder: Secondary | ICD-10-CM | POA: Diagnosis not present

## 2021-04-19 DIAGNOSIS — J019 Acute sinusitis, unspecified: Secondary | ICD-10-CM | POA: Diagnosis not present

## 2021-04-24 DIAGNOSIS — M67912 Unspecified disorder of synovium and tendon, left shoulder: Secondary | ICD-10-CM | POA: Diagnosis not present

## 2021-04-24 DIAGNOSIS — M25511 Pain in right shoulder: Secondary | ICD-10-CM | POA: Diagnosis not present

## 2021-05-15 DIAGNOSIS — M67912 Unspecified disorder of synovium and tendon, left shoulder: Secondary | ICD-10-CM | POA: Diagnosis not present

## 2021-05-29 ENCOUNTER — Other Ambulatory Visit: Payer: Self-pay | Admitting: Orthopedic Surgery

## 2021-05-29 DIAGNOSIS — M25512 Pain in left shoulder: Secondary | ICD-10-CM

## 2021-06-27 ENCOUNTER — Ambulatory Visit
Admission: RE | Admit: 2021-06-27 | Discharge: 2021-06-27 | Disposition: A | Discharge status: Home / Self Care | Payer: BC Managed Care – PPO | Source: Ambulatory Visit | Attending: Orthopedic Surgery | Admitting: Orthopedic Surgery

## 2021-06-27 DIAGNOSIS — S46012A Strain of muscle(s) and tendon(s) of the rotator cuff of left shoulder, initial encounter: Secondary | ICD-10-CM | POA: Diagnosis not present

## 2021-06-27 DIAGNOSIS — R531 Weakness: Secondary | ICD-10-CM | POA: Diagnosis not present

## 2021-07-03 DIAGNOSIS — M75112 Incomplete rotator cuff tear or rupture of left shoulder, not specified as traumatic: Secondary | ICD-10-CM | POA: Diagnosis not present

## 2021-10-03 ENCOUNTER — Ambulatory Visit
Admission: EM | Admit: 2021-10-03 | Discharge: 2021-10-03 | Disposition: A | Discharge status: Home / Self Care | Payer: Managed Care, Other (non HMO) | Attending: Internal Medicine | Admitting: Internal Medicine

## 2021-10-03 DIAGNOSIS — M109 Gout, unspecified: Secondary | ICD-10-CM

## 2021-10-03 DIAGNOSIS — M79671 Pain in right foot: Secondary | ICD-10-CM

## 2021-10-03 MED ORDER — COLCHICINE 0.6 MG PO TABS: 0.6000 mg | ORAL_TABLET | Freq: Every day | ORAL | 0 refills | Status: DC

## 2021-10-03 MED ORDER — METHYLPREDNISOLONE SODIUM SUCC 125 MG IJ SOLR
80.0000 mg | Freq: Once | INTRAMUSCULAR | Status: AC
  Administered 2021-10-03: 80 mg via INTRAMUSCULAR

## 2021-10-03 NOTE — ED Provider Notes (Signed)
[facility]    CSN: [id number] Arrival date & time: 10/03/21  0802      History   Chief Complaint Chief Complaint  Patient presents with   Gout    HPI Daniel Orozco is a 51 y.o. male.   Patient presents with right lateral foot pain that started about 3 days ago.  Patient reports history of gout and states that this feels similar.  Denies eating any high purine foods lately including fish, alcohol, red meat.  Last gout flareup was about 3 years ago per patient.  He takes colchicine typically as well as a steroid shot that resolved symptoms.  Denies any obvious injury recently to the foot.  Patient has taken ibuprofen with no improvement. Denies fever, body aches, chills.      Past Medical History:  Diagnosis Date   Allergy    Arthritis    Blood transfusion without reported diagnosis    Crohn disease ([place])    in remission since 2000   Paroxysmal atrial fibrillation ([place])    S/P balloon dilatation of esophageal stricture     Patient Active Problem List   Diagnosis Date Noted   Encounter for wellness examination 10/13/2017   Screening for multiple conditions 08/24/2017   Environmental and seasonal allergies 08/24/2017   Gouty arthropathy 08/24/2017   Non-celiac gluten sensitivity 08/24/2017   Family history of prostate cancer in father 08/24/2017   Routine adult health maintenance 11/21/2016   A-fib ([place]) 07/03/2015   Gout 06/26/2015   PAF (paroxysmal atrial fibrillation) ([place]) 06/26/2015   Atrial fibrillation with RVR ([place]) 03/27/2015   Chronic daily headache 02/23/2014   Mood disorder ([place])-  history of anxiety depression and ADHD 02/23/2014   Snoring 02/23/2014   Vision changes 02/23/2014   GERD 08/07/2009   CROHN'S DISEASE-SMALL INTESTINE 08/07/2009   WEIGHT LOSS-ABNORMAL 08/07/2009   NAUSEA WITH VOMITING 08/07/2009   NAUSEA ALONE 08/07/2009   ABDOMINAL PAIN-PERIUMBILICAL 69/48/5462   ABDOMINAL PAIN-EPIGASTRIC 08/07/2009   ABDOMINAL PAIN,  UPPER 08/07/2009   ADHD 08/19/2007   HAY FEVER 08/19/2007   ALLERGIC RHINITIS 08/19/2007   INGUINAL HERNIA, RIGHT 08/19/2007   Asthma 06/22/2007   CROHN'S DISEASE 06/22/2007   DYSPHAGIA UNSPECIFIED 06/22/2007   BENIGN NEOPLASM OTH&UNSPEC SITE DIGESTIVE SYSTEM 03/01/2007   ESOPHAGEAL STRICTURE 01/25/2007   PAIN IN JOINT, ANKLE/FOOT 08/07/2006   HALLUX RIGIDUS, ACQUIRED 08/07/2006   HIATAL HERNIA 11/15/2005   FOREIGN BODY IN ESOPHAGUS 11/15/2005    Past Surgical History:  Procedure Laterality Date   ABLATION     CARDIOVERSION N/A 03/28/2015   Procedure: CARDIOVERSION;  Surgeon: Dorothy Spark, MD;  [facility] ENDOSCOPY;  Service: Cardiovascular;  Laterality: N/A;   ELECTROPHYSIOLOGIC STUDY N/A 07/03/2015   atrial fibrillation ablation by Dr Rayann Heman   ESOPHAGEAL DILATION     FRACTURE SURGERY     TEE WITHOUT CARDIOVERSION N/A 03/28/2015   Procedure: TRANSESOPHAGEAL ECHOCARDIOGRAM (TEE);  Surgeon: Dorothy Spark, MD;  Location: [facility] ENDOSCOPY;  Service: Cardiovascular;  Laterality: N/A;       Home Medications    Prior to Admission medications   Medication Sig Start Date End Date Taking? Authorizing Provider  colchicine 0.6 MG tablet Take 1 tablet (0.6 mg total) by mouth daily. Take 2 pills now, then take 1 pill 1 hour later. 10/03/21  Yes , Hildred Alamin E, FNP  allopurinol (ZYLOPRIM) 100 MG tablet Take 100 mg by mouth daily. 04/20/20   [provider]  colchicine 0.6 MG tablet TAKE 2 TABLETS BY MOUTH FOR  1 DOSE, THEN TAKE 1 TABLET BY MOUTH 1 HOUR LATER. TAKE FOR AN ACUTE GOUT ATTACK. 12/24/20   Lorrene Reid, PA-C  flecainide (TAMBOCOR) 100 MG tablet Take 3 tablets (300 mg total) by mouth once for 1 dose. Patient not taking: Reported on 04/22/2020 11/20/19 11/20/19  [name], MD  ibuprofen (ADVIL) 200 MG tablet Take 400 mg by mouth every 6 (six) hours as needed for fever, headache or mild pain. Patient not taking: Reported on 09/18/2020    [provider]   metoprolol succinate (TOPROL-XL) 25 MG 24 hr tablet TAKE 1&1/2 TABLETS (37.5 MG TOTAL) BY MOUTH DAILY. Patient not taking: Reported on 09/18/2020 02/28/19   [name], MD  silver sulfADIAZINE (SILVADENE) 1 % cream Apply 1 application topically daily. Patient not taking: Reported on 09/18/2020 04/23/20   Lorrene Reid, PA-C    Family History Family History  Problem Relation Age of Onset   Heart disease Maternal Grandfather    Heart disease Father        RBBB   Prostate cancer Father    Healthy Maternal Grandmother    Healthy Paternal Grandmother    Healthy Paternal Grandfather    Prostate cancer Paternal Grandfather    Healthy Mother    Celiac disease Mother    Stomach cancer Neg Hx    Colon cancer Neg Hx     Social History Social History   Tobacco Use   Smoking status: Former    Packs/day: 1.00    Years: 2.00    Total pack years: 2.00    Types: Cigarettes   Smokeless tobacco: Never   Tobacco comments:    quit in 1999, only smoked for 2 years  Vaping Use   Vaping Use: Never used  Substance Use Topics   Alcohol use: Yes    Alcohol/week: 0.0 standard drinks of alcohol    Comment: Occasionally   Drug use: No     Allergies   Other   Review of Systems Review of Systems Per HPI  Physical Exam Triage Vital Signs ED Triage Vitals  Enc Vitals Group     BP 10/03/21 0814 130/90     Pulse Rate 10/03/21 0814 90     Resp 10/03/21 0814 18     Temp 10/03/21 0814 98.4 F (36.9 C)     Temp Source 10/03/21 0814 Oral     SpO2 10/03/21 0814 98 %     Weight --      Height --      Head Circumference --      Peak Flow --      Pain Score 10/03/21 0815 0     Pain Loc --      Pain Edu? --      Excl. in [place]? --    No data found.  Updated Vital Signs BP 130/90 (BP Location: Left Arm)   Pulse 90   Temp 98.4 F (36.9 C) (Oral)   Resp 18   SpO2 98%   Visual Acuity Right Eye Distance:   Left Eye Distance:   Bilateral Distance:    Right Eye Near:   Left Eye  Near:    Bilateral Near:     Physical Exam Constitutional:      General: He is not in acute distress.    Appearance: Normal appearance. He is not toxic-appearing or diaphoretic.  HENT:     Head: Normocephalic and atraumatic.  Eyes:     Extraocular Movements: Extraocular movements intact.  Conjunctiva/sclera: Conjunctivae normal.  Pulmonary:     Effort: Pulmonary effort is normal.  Feet:     Comments: Tenderness to palpation, redness, warmth, mild swelling located to right lateral foot at fourth and fifth metatarsals.  Patient has full range of motion of toes and foot.  Neurovascular intact. Neurological:     General: No focal deficit present.     Mental Status: He is alert and oriented to person, place, and time. Mental status is at baseline.  Psychiatric:        Mood and Affect: Mood normal.        Behavior: Behavior normal.        Thought Content: Thought content normal.        Judgment: Judgment normal.      UC Treatments / Results  Labs (all labs ordered are listed, but only abnormal results are displayed) Labs Reviewed - No data to display  EKG   Radiology No results found.  Procedures Procedures (including critical care time)  Medications Ordered in UC Medications  methylPREDNISolone sodium succinate (SOLU-MEDROL) 125 mg/2 mL injection 80 mg (80 mg Intramuscular Given 10/03/21 0830)    Initial Impression / Assessment and Plan / UC Course  I have reviewed the triage vital signs and the nursing notes.  Pertinent labs & imaging results that were available during my care of the patient were reviewed by me and considered in my medical decision making (see chart for details).     Physical exam is consistent with gout given patient's history.  No concern for septic joints.  Will treat with colchicine and steroid injection as patient reports resolution with this treatment plan in the past.  Denies that he takes any daily medications including allopurinol given  that he has not seen a PCP in about 2 years.  Advised patient to follow-up with PCP for further evaluation and management as he may need to restart allopurinol to prevent gout flareups.  Patient was given strict return and ER precautions.  Patient verbalized understanding and was agreeable with plan. Final Clinical Impressions(s) / UC Diagnoses   Final diagnoses:  Right foot pain  Acute gout of right foot, unspecified cause     Discharge Instructions      You are being treated for gout flareup with colchicine and steroid shot.  Please follow-up with primary care doctor for further evaluation and management as well.    ED Prescriptions     Medication Sig Dispense Auth. Provider   colchicine 0.6 MG tablet Take 1 tablet (0.6 mg total) by mouth daily. Take 2 pills now, then take 1 pill 1 hour later. 3 tablet [place], Michele Rockers, [place]      PDMP not reviewed this encounter.   Teodora Medici,  10/03/21 [phone]

## 2021-10-03 NOTE — ED Triage Notes (Signed)
Pt c/o concerned for gout of right foot x 3 days

## 2021-10-03 NOTE — Discharge Instructions (Signed)
You are being treated for gout flareup with colchicine and steroid shot.  Please follow-up with primary care doctor for further evaluation and management as well.

## 2021-11-12 ENCOUNTER — Telehealth: Payer: Self-pay | Admitting: Internal Medicine

## 2021-11-12 NOTE — Telephone Encounter (Signed)
Message sent to [place] scheduler to reach out to the pt with an appt for preop clearance

## 2021-11-12 NOTE — Telephone Encounter (Signed)
   Pre-operative Risk Assessment    Patient Name: Daniel Orozco  DOB: November 15, 1970 MRN: [id number]      Request for Surgical Clearance    Procedure:   Left shoulder orthoscopic rotator cuff debridement   Date of Surgery:  Clearance 12/03/21                                 Surgeon:  Dr. Tamera Punt Surgeon's Group or Practice Name:  [facility]  Phone number:  [phone] Fax number:  [phone]   Type of Clearance Requested:   - Medical  - Pharmacy:  Hold    Not sure if on anything, but wants to make sure.   Type of Anesthesia:   Choice   Additional requests/questions:    Signed, April Henson   11/12/2021, 2:39 PM

## 2021-11-12 NOTE — Telephone Encounter (Signed)
   Name: Daniel Orozco  DOB: [birth year]  MRN: [id number]  Primary Cardiologist: None  Chart reviewed as part of pre-operative protocol coverage. Because of Daniel Orozco's past medical history and time since last visit, he will require a follow-up in-office visit in order to better assess preoperative cardiovascular risk.  Pre-op covering staff: - Please schedule appointment and call patient to inform them. If patient already had an upcoming appointment within acceptable timeframe, please add "pre-op clearance" to the appointment notes so provider is aware. - Please contact requesting surgeon's office via preferred method (i.e, phone, fax) to inform them of need for appointment prior to surgery.    Mable Fill, Marissa Nestle, NP  11/12/2021, 3:35 PM

## 2021-11-15 NOTE — Telephone Encounter (Signed)
Appt 11/20/21 with Tommye Standard, PAC

## 2021-11-17 NOTE — Progress Notes (Signed)
Cardiology Office Note Date:  11/17/2021  Patient ID:  Daniel Orozco, Daniel Orozco Mar 30, 1971, MRN [id number] PCP:  Lorrene Reid, PA-C  Electrophysiologist: Dr. Rayann Heman    Chief Complaint:  over due annual visit /pre-op  History of Present Illness: Daniel Orozco is a 51 y.o. male with history of AFib (ablated 2017).  He comes in to be see for Dr. Rayann Heman, last seen by him 11/21/2019, doing well, had recent use of his PRN flecainide that worked well No changes were made. Maintained off a/c with a risk score of zero.  Needs eval prior to L shoulder surgery   RCRI score is zero, 0.4%  He feels great. No new medical history/changes in history since his last visit No AFib, has not had to use his PRN flecainide. No CP, palpitations No SOB No near syncope or syncope.  He is very active, plays tennis before his shoulder injury, golfs, reports excellent exertional capacity without changes.   Past Medical History:  Diagnosis Date   Allergy    Arthritis    Blood transfusion without reported diagnosis    Crohn disease ([place])    in remission since 2000   Paroxysmal atrial fibrillation ([place])    S/P balloon dilatation of esophageal stricture     Past Surgical History:  Procedure Laterality Date   ABLATION     CARDIOVERSION N/A 03/28/2015   Procedure: CARDIOVERSION;  Surgeon: Dorothy Spark, MD;  Location: [facility];  Service: Cardiovascular;  Laterality: N/A;   ELECTROPHYSIOLOGIC STUDY N/A 07/03/2015   atrial fibrillation ablation by Dr Rayann Heman   ESOPHAGEAL DILATION     FRACTURE SURGERY     TEE WITHOUT CARDIOVERSION N/A 03/28/2015   Procedure: TRANSESOPHAGEAL ECHOCARDIOGRAM (TEE);  Surgeon: Dorothy Spark, MD;  Location: [facility] ENDOSCOPY;  Service: Cardiovascular;  Laterality: N/A;    Current Outpatient Medications  Medication Sig Dispense Refill   allopurinol (ZYLOPRIM) 100 MG tablet Take 100 mg by mouth daily.     colchicine 0.6 MG tablet TAKE 2 TABLETS BY MOUTH FOR 1 DOSE,  THEN TAKE 1 TABLET BY MOUTH 1 HOUR LATER. TAKE FOR AN ACUTE GOUT ATTACK. 15 tablet 0   colchicine 0.6 MG tablet Take 1 tablet (0.6 mg total) by mouth daily. Take 2 pills now, then take 1 pill 1 hour later. 3 tablet 0   flecainide (TAMBOCOR) 100 MG tablet Take 3 tablets (300 mg total) by mouth once for 1 dose. (Patient not taking: Reported on 04/22/2020) 12 tablet 1   ibuprofen (ADVIL) 200 MG tablet Take 400 mg by mouth every 6 (six) hours as needed for fever, headache or mild pain. (Patient not taking: Reported on 09/18/2020)     metoprolol succinate (TOPROL-XL) 25 MG 24 hr tablet TAKE 1&1/2 TABLETS (37.5 MG TOTAL) BY MOUTH DAILY. (Patient not taking: Reported on 09/18/2020) 135 tablet 2   silver sulfADIAZINE (SILVADENE) 1 % cream Apply 1 application topically daily. (Patient not taking: Reported on 09/18/2020) 50 g 0   Current Facility-Administered Medications  Medication Dose Route Frequency Provider Last Rate Last Admin   0.9 %  sodium chloride infusion  500 mL Intravenous Once Nandigam, Venia Minks, MD        Allergies:   Other   Social History:  The patient  reports that he has quit smoking. He has a 2.00 pack-year smoking history. He has never used smokeless tobacco. He reports current alcohol use. He reports that he does not use drugs.   Family History:  The patient's family  history includes Celiac disease in his mother; Healthy in his maternal grandmother, mother, paternal grandfather, and paternal grandmother; Heart disease in his father and maternal grandfather; Prostate cancer in his father and paternal grandfather.  ROS:  Please see the history of present illness.    All other systems are reviewed and otherwise negative.   PHYSICAL EXAM:  VS:  There were no vitals taken for this visit. BMI: There is no height or weight on file to calculate BMI. Well nourished, well developed, in no acute distress HEENT: normocephalic, atraumatic Neck: no JVD, carotid bruits or masses Cardiac:  RRR; no  significant murmurs, no rubs, or gallops Lungs:  CTA b/l, no wheezing, rhonchi or rales Abd: soft, nontender MS: no deformity or atrophy Ext: no edema Skin: warm and dry, no rash Neuro:  No gross deficits appreciated Psych: euthymic mood, full affect   EKG:  Done today and reviewed by myself shows  SR 91bpm, LAD, unchanged from prior  01/21/2016: TTE Study Conclusions  - Left ventricle: The cavity size was normal. There was mild focal    basal and mild concentric hypertrophy of the septum. Systolic    function was normal. The estimated ejection fraction was in the    range of 55% to 60%. Wall motion was normal; there were no    regional wall motion abnormalities. Doppler parameters are    consistent with abnormal left ventricular relaxation (grade 1    diastolic dysfunction).  - Aortic valve: Transvalvular velocity was within the normal range.    There was no stenosis. There was no regurgitation.  - Mitral valve: Transvalvular velocity was within the normal range.    There was no evidence for stenosis. There was trivial    regurgitation.  - Right ventricle: The cavity size was normal. Wall thickness was    normal. Systolic function was normal.  - Atrial septum: No defect or patent foramen ovale was identified    by color flow Doppler.  - Tricuspid valve: There was physiologic regurgitation.  - Pulmonary arteries: Systolic pressure was within the normal    range.    07/03/2015: EPS/ablation CONCLUSIONS: 1. Sinus rhythm upon presentation.   2. Intracardiac echo reveals a normal sized left atrium with four separate pulmonary veins without evidence of pulmonary vein stenosis. 3. Successful electrical isolation and anatomical encircling of all four pulmonary veins with radiofrequency current. 4. No inducible arrhythmias following ablation both on and off of dobutamine 5. No early apparent complications.  04/19/2015: ETT There was no ST segment deviation noted during stress. No T  wave inversion was noted during stress.  Recent Labs: No results found for requested labs within last 365 days.  No results found for requested labs within last 365 days.   CrCl cannot be calculated (Patient's most recent lab result is older than the maximum 21 days allowed.).   Wt Readings from Last 3 Encounters:  09/18/20 182 lb 1.6 oz (82.6 kg)  04/25/20 178 lb 1.6 oz (80.8 kg)  04/22/20 173 lb (78.5 kg)     Other studies reviewed: Additional studies/records reviewed today include: summarized above  ASSESSMENT AND PLAN:  Paroxysmal Afib CHA2DS2Vasc is zero Ablation 2016 zero burden by symptoms PRN flecainide He is off the metoprolol, discussed need to take the metoprolol if/when he takes flecainide.    2. Borderline BP today A repeat BP by myself is 128/82 Advised he minimize sodium and keep an eye on it with his PMD  3.  Pre-op, shoulder surgery Low  cardiac risk surgery Low cardiac risk score reviewed with him No need for any pre-operative cardiac testing No cardiac contraindication to shoulder surgery  Disposition: F/u with [place] annually, discussed need to establish with a new EP MD< will have him see Dr. Quentin Ore next year, sooner if needed  Current medicines are reviewed at length with the patient today.  The patient did not have any concerns regarding medicines.  Venetia Night, PA-C 11/17/2021 3:08 PM     [address] [place] [address] [phone] (office)  [phone] (fax)

## 2021-11-20 ENCOUNTER — Ambulatory Visit (INDEPENDENT_AMBULATORY_CARE_PROVIDER_SITE_OTHER): Payer: Managed Care, Other (non HMO) | Admitting: Physician Assistant

## 2021-11-20 ENCOUNTER — Encounter: Payer: Self-pay | Admitting: Physician Assistant

## 2021-11-20 VITALS — BP 122/90 | HR 91 | Ht 73.0 in | Wt 171.2 lb

## 2021-11-20 DIAGNOSIS — Z01818 Encounter for other preprocedural examination: Secondary | ICD-10-CM

## 2021-11-20 DIAGNOSIS — I480 Paroxysmal atrial fibrillation: Secondary | ICD-10-CM | POA: Diagnosis not present

## 2021-11-20 DIAGNOSIS — Z79899 Other long term (current) drug therapy: Secondary | ICD-10-CM | POA: Diagnosis not present

## 2021-11-20 MED ORDER — METOPROLOL SUCCINATE ER 25 MG PO TB24: 25.0000 mg | ORAL_TABLET | Freq: Every day | ORAL | 0 refills | Status: DC | PRN

## 2021-11-20 NOTE — Patient Instructions (Signed)
Medication Instructions:    START TAKING METOPROLOL 25 MG AS NEEDED FOR AFIB WHEN TAKING FELCAINIDE   *If you need a refill on your cardiac medications before your next appointment, please call your pharmacy*   Lab Work: [place]    If you have labs (blood work) drawn today and your tests are completely normal, you will receive your results only by: [place] (if you have MyChart) OR A paper copy in the mail If you have any lab test that is abnormal or we need to change your treatment, we will call you to review the results.   Testing/Procedures: NONE ORDERED  TODAY      Follow-Up: At [facility], you and your health needs are our priority.  As part of our continuing mission to provide you with exceptional heart care, we have created designated Provider Care Teams.  These Care Teams include your primary Cardiologist (physician) and Advanced Practice Providers (APPs -  Physician Assistants and Nurse Practitioners) who all work together to provide you with the care you need, when you need it.  We recommend signing up for the patient portal called "MyChart".  Sign up information is provided on this After Visit Summary.  MyChart is used to connect with patients for Virtual Visits (Telemedicine).  Patients are able to view lab/test results, encounter notes, upcoming appointments, etc.  Non-urgent messages can be sent to your provider as well.   To learn more about what you can do with MyChart, go to [email or web address].    Your next appointment:    1 year(s)  The format for your next appointment:   In Person  Provider:   You may see  Dr.Lambert or one of the following Advanced Practice Providers on your designated Care Team:   Tommye Standard, [place] Legrand Como "Jonni Sanger" Chalmers Cater, [place]  Other Instructions   Important Information About [place]

## 2022-03-19 ENCOUNTER — Telehealth: Payer: Self-pay | Admitting: *Deleted

## 2022-03-19 ENCOUNTER — Other Ambulatory Visit: Payer: Self-pay | Admitting: Nurse Practitioner

## 2022-03-19 DIAGNOSIS — M109 Gout, unspecified: Secondary | ICD-10-CM

## 2022-03-19 MED ORDER — ALLOPURINOL 100 MG PO TABS: 100.0000 mg | ORAL_TABLET | Freq: Every day | ORAL | 0 refills | Status: DC

## 2022-03-19 NOTE — Telephone Encounter (Addendum)
Pt called requesting a refill on his allopurinol. I informed him that it has been a while since he has been seen and that I do not see this on his current med list.  He spoke like this Rx has been a problem all the time. Wanted to see if he could have it filled until his appointment.  Should be sent to [facility].  Scheduled an appointment for med refills on 04/23/22.  Informed him that at his last visit he was supposed to schedule a physical.  Informed him that at his next visit we would get the physical scheduled. Please advise. Kristie Bracewell Zimmerman Rumple, CMA

## 2022-03-19 NOTE — Telephone Encounter (Signed)
Pt informed.Racquelle Hyser Zimmerman Rumple, CMA ° °

## 2022-03-19 NOTE — Telephone Encounter (Signed)
Ok. I sent this to [facility] for 30 days. I will set up CPE during this next appointment Thanks  -HB

## 2022-04-23 ENCOUNTER — Ambulatory Visit (INDEPENDENT_AMBULATORY_CARE_PROVIDER_SITE_OTHER): Payer: Managed Care, Other (non HMO) | Admitting: Nurse Practitioner

## 2022-04-23 ENCOUNTER — Encounter: Payer: Self-pay | Admitting: Nurse Practitioner

## 2022-04-23 VITALS — BP 120/85 | HR 98 | Ht 73.0 in | Wt 176.8 lb

## 2022-04-23 DIAGNOSIS — I4891 Unspecified atrial fibrillation: Secondary | ICD-10-CM | POA: Diagnosis not present

## 2022-04-23 DIAGNOSIS — M109 Gout, unspecified: Secondary | ICD-10-CM | POA: Diagnosis not present

## 2022-04-23 DIAGNOSIS — Z23 Encounter for immunization: Secondary | ICD-10-CM

## 2022-04-23 MED ORDER — ALLOPURINOL 100 MG PO TABS: 100.0000 mg | ORAL_TABLET | Freq: Every day | ORAL | 1 refills | Status: DC

## 2022-04-23 NOTE — Progress Notes (Signed)
Established patient visit   Patient: Daniel Orozco   DOB: 11-05-1970   52 y.o. Male  MRN: [id number] Visit Date: 04/23/2022   Chief Complaint  Patient presents with   Medication Refill   Subjective    HPI  Follow up  -chronic gout. Needs refills of allopurinol.  Does have intermittent a fib.  -takes metoprolol if needed.   denies chest pain, chest pressure, or shortness of breath. He denies headaches or visual disturbances. He denies abdominal pain, nausea, vomiting, or changes in bowel or bladder habits.     Medications: Outpatient Medications Prior to Visit  Medication Sig   flecainide (TAMBOCOR) 100 MG tablet Take 300 mg by mouth as needed (if Afib).   ibuprofen (ADVIL) 200 MG tablet Take 400 mg by mouth every 6 (six) hours as needed for fever, headache or mild pain.   metoprolol succinate (TOPROL XL) 25 MG 24 hr tablet Take 1 tablet (25 mg total) by mouth daily as needed (FOR AFIB WHEN  TAKING FLECAINIDE).   [DISCONTINUED] allopurinol (ZYLOPRIM) 100 MG tablet Take 1 tablet (100 mg total) by mouth daily.   Facility-Administered Medications Prior to Visit  Medication Dose Route Frequency Provider   0.9 %  sodium chloride infusion  500 mL Intravenous Once Nandigam, Venia Minks, MD    Review of Systems  Constitutional:  Negative for activity change, chills, fatigue and fever.  HENT:  Negative for congestion, postnasal drip, rhinorrhea, sinus pressure, sinus pain, sneezing and sore throat.   Eyes: Negative.   Respiratory:  Negative for cough, shortness of breath and wheezing.   Cardiovascular:  Negative for chest pain and palpitations.  Gastrointestinal:  Negative for constipation, diarrhea, nausea and vomiting.  Endocrine: Negative for cold intolerance, heat intolerance, polydipsia and polyuria.  Genitourinary:  Negative for dysuria, frequency and urgency.  Musculoskeletal:  Negative for back pain and myalgias.  Skin:  Negative for rash.  Allergic/Immunologic: Negative  for environmental allergies.  Neurological:  Negative for dizziness, weakness and headaches.  Psychiatric/Behavioral:  The patient is not nervous/anxious.     Last CBC Lab Results  Component Value Date   WBC 6.5 04/22/2020   HGB 15.1 04/22/2020   HCT 41.4 04/22/2020   MCV 84.8 04/22/2020   MCH 30.9 04/22/2020   RDW 11.0 (L) 04/22/2020   PLT 276 [id number]   Last metabolic panel Lab Results  Component Value Date   GLUCOSE 110 (H) 04/22/2020   NA 139 04/22/2020   K 3.7 04/22/2020   CL 105 04/22/2020   CO2 23 04/22/2020   BUN 10 04/22/2020   CREATININE 0.72 04/22/2020   GFRNONAA >60 04/22/2020   CALCIUM 9.4 04/22/2020   PROT 6.5 10/13/2017   ALBUMIN 4.4 10/13/2017   LABGLOB 2.1 10/13/2017   AGRATIO 2.1 10/13/2017   BILITOT 0.7 10/13/2017   ALKPHOS 58 10/13/2017   AST 20 10/13/2017   ALT 22 10/13/2017   ANIONGAP 11 04/22/2020   Last lipids Lab Results  Component Value Date   CHOL 174 10/13/2017   HDL 40 10/13/2017   LDLCALC 116 (H) 10/13/2017   TRIG 90 10/13/2017   CHOLHDL 4.4 10/13/2017   Last hemoglobin A1c Lab Results  Component Value Date   HGBA1C 5.1 10/13/2017   Last thyroid functions Lab Results  Component Value Date   TSH 1.560 10/13/2017   Last vitamin D Lab Results  Component Value Date   VD25OH 29.7 (L) 10/13/2017       Objective     Today's Vitals  04/23/22 1514  BP: 120/85  Pulse: 98  SpO2: 99%  Weight: 176 lb 12.8 oz (80.2 kg)  Height: 6' 1"$  (1.854 m)   Body mass index is 23.33 kg/m.  BP Readings from Last 3 Encounters:  05/22/22 (Abnormal) 144/93  04/23/22 120/85  11/20/21 (Abnormal) 122/90    Wt Readings from Last 3 Encounters:  05/22/22 169 lb (76.7 kg)  04/23/22 176 lb 12.8 oz (80.2 kg)  11/20/21 171 lb 3.2 oz (77.7 kg)    Physical Exam Vitals and nursing note reviewed.  Constitutional:      Appearance: Normal appearance. He is well-developed.  HENT:     Head: Normocephalic and atraumatic.     Nose: Nose  normal.     Mouth/Throat:     Mouth: Mucous membranes are moist.     Pharynx: Oropharynx is clear.  Eyes:     Extraocular Movements: Extraocular movements intact.     Conjunctiva/sclera: Conjunctivae normal.     Pupils: Pupils are equal, round, and reactive to light.  Cardiovascular:     Rate and Rhythm: Normal rate and regular rhythm.     Pulses: Normal pulses.     Heart sounds: Normal heart sounds.  Pulmonary:     Effort: Pulmonary effort is normal.     Breath sounds: Normal breath sounds.  Abdominal:     Palpations: Abdomen is soft.  Musculoskeletal:        General: Normal range of motion.     Cervical back: Normal range of motion and neck supple.  Lymphadenopathy:     Cervical: No cervical adenopathy.  Skin:    General: Skin is warm and dry.     Capillary Refill: Capillary refill takes less than 2 seconds.  Neurological:     General: No focal deficit present.     Mental Status: He is alert and oriented to person, place, and time.  Psychiatric:        Mood and Affect: Mood normal.        Behavior: Behavior normal.        Thought Content: Thought content normal.        Judgment: Judgment normal.       Assessment & Plan    1. Gouty arthropathy Continue allopurinol 100 mg daily to prevent gout flare up. Refills provided today.  - allopurinol (ZYLOPRIM) 100 MG tablet; Take 1 tablet (100 mg total) by mouth daily.  Dispense: 90 tablet; Refill: 1  2. Atrial fibrillation with RVR (HCC) No recent episodes of atrial fibrillation. Continue metoprolol as needed.   3. Need for influenza vaccination Flu vaccine administered during today's visit.  - Flu Vaccine QUAD 6+ mos PF IM (Fluarix Quad PF)  Problem List Items Addressed This Visit       Cardiovascular and Mediastinum   Atrial fibrillation with RVR (HCC)     Musculoskeletal and Integument   Gouty arthropathy - Primary   Relevant Medications   allopurinol (ZYLOPRIM) 100 MG tablet   Other Visit Diagnoses     Need  for influenza vaccination       Relevant Orders   Flu Vaccine QUAD 6+ mos PF IM (Fluarix Quad PF) (Completed)        Return in about 6 months (around 10/22/2022) for health maintenance exam, FBW a week prior to visit. add PSA .         Ronnell Freshwater, NP  [facility] at [facility] [phone] (phone) [phone] (fax)  [place]

## 2022-05-22 ENCOUNTER — Ambulatory Visit
Admission: RE | Admit: 2022-05-22 | Discharge: 2022-05-22 | Disposition: A | Discharge status: Home / Self Care | Payer: Managed Care, Other (non HMO) | Source: Ambulatory Visit | Attending: Physician Assistant | Admitting: Physician Assistant

## 2022-05-22 VITALS — BP 144/93 | HR 88 | Temp 98.9°F | Resp 18 | Ht 72.0 in | Wt 169.0 lb

## 2022-05-22 DIAGNOSIS — M109 Gout, unspecified: Secondary | ICD-10-CM

## 2022-05-22 MED ORDER — COLCHICINE 0.6 MG PO TABS: 0.6000 mg | ORAL_TABLET | Freq: Every day | ORAL | 2 refills | Status: DC

## 2022-05-22 MED ORDER — METHYLPREDNISOLONE SODIUM SUCC 125 MG IJ SOLR
125.0000 mg | Freq: Once | INTRAMUSCULAR | Status: AC
  Administered 2022-05-22: 125 mg via INTRAMUSCULAR

## 2022-05-22 NOTE — ED Provider Notes (Signed)
[facility]    CSN: [id number] Arrival date & time: 05/22/22  7169      History   Chief Complaint Chief Complaint  Patient presents with   Foot Pain    Gout - Entered by patient    HPI Daniel Orozco is a 52 y.o. male.   Patient complains of gout in his left second toe.  Patient has a history of gout he has been taking allopurinol without relief he is requesting an injection of Solu-Medrol as this usually works and colchicine.  Patient denies any fever or chills he denies any injuries   Foot Pain    Past Medical History:  Diagnosis Date   Allergy    Arthritis    Blood transfusion without reported diagnosis    Crohn disease ([place])    in remission since 2000   Paroxysmal atrial fibrillation ([place])    S/P balloon dilatation of esophageal stricture     Patient Active Problem List   Diagnosis Date Noted   Encounter for wellness examination 10/13/2017   Screening for multiple conditions 08/24/2017   Environmental and seasonal allergies 08/24/2017   Gouty arthropathy 08/24/2017   Non-celiac gluten sensitivity 08/24/2017   Family history of prostate cancer in father 08/24/2017   Routine adult health maintenance 11/21/2016   A-fib ([place]) 07/03/2015   Gout 06/26/2015   PAF (paroxysmal atrial fibrillation) ([place]) 06/26/2015   Atrial fibrillation with RVR ([place]) 03/27/2015   Chronic daily headache 02/23/2014   Mood disorder ([place])-  history of anxiety depression and ADHD 02/23/2014   Snoring 02/23/2014   Vision changes 02/23/2014   GERD 08/07/2009   CROHN'S DISEASE-SMALL INTESTINE 08/07/2009   WEIGHT LOSS-ABNORMAL 08/07/2009   NAUSEA WITH VOMITING 08/07/2009   NAUSEA ALONE 08/07/2009   ABDOMINAL PAIN-PERIUMBILICAL 67/89/3810   ABDOMINAL PAIN-EPIGASTRIC 08/07/2009   ABDOMINAL PAIN, UPPER 08/07/2009   ADHD 08/19/2007   HAY FEVER 08/19/2007   ALLERGIC RHINITIS 08/19/2007   INGUINAL HERNIA, RIGHT 08/19/2007   Asthma 06/22/2007   CROHN'S DISEASE 06/22/2007    DYSPHAGIA UNSPECIFIED 06/22/2007   BENIGN NEOPLASM OTH&UNSPEC SITE DIGESTIVE SYSTEM 03/01/2007   ESOPHAGEAL STRICTURE 01/25/2007   PAIN IN JOINT, ANKLE/FOOT 08/07/2006   HALLUX RIGIDUS, ACQUIRED 08/07/2006   HIATAL HERNIA 11/15/2005   FOREIGN BODY IN ESOPHAGUS 11/15/2005    Past Surgical History:  Procedure Laterality Date   ABLATION     CARDIOVERSION N/A 03/28/2015   Procedure: CARDIOVERSION;  Surgeon: Dorothy Spark, MD;  Location: [facility] ENDOSCOPY;  Service: Cardiovascular;  Laterality: N/A;   ELECTROPHYSIOLOGIC STUDY N/A 07/03/2015   atrial fibrillation ablation by Dr Rayann Heman   ESOPHAGEAL DILATION     FRACTURE SURGERY     TEE WITHOUT CARDIOVERSION N/A 03/28/2015   Procedure: TRANSESOPHAGEAL ECHOCARDIOGRAM (TEE);  Surgeon: Dorothy Spark, MD;  Location: [facility] ENDOSCOPY;  Service: Cardiovascular;  Laterality: N/A;       Home Medications    Prior to Admission medications   Medication Sig Start Date End Date Taking? Authorizing Provider  allopurinol (ZYLOPRIM) 100 MG tablet Take 1 tablet (100 mg total) by mouth daily. 04/23/22  Yes Boscia, Greer Ee, NP  colchicine 0.6 MG tablet Take 1 tablet (0.6 mg total) by mouth daily. 05/22/22 05/22/23 Yes Fransico Meadow, PA-C  flecainide (TAMBOCOR) 100 MG tablet Take 300 mg by mouth as needed (if Afib).   Yes [provider]  ibuprofen (ADVIL) 200 MG tablet Take 400 mg by mouth every 6 (six) hours as needed for fever, headache or mild pain.  Yes [provider]  metoprolol succinate (TOPROL XL) 25 MG 24 hr tablet Take 1 tablet (25 mg total) by mouth daily as needed (FOR AFIB WHEN  TAKING FLECAINIDE). 11/20/21  Yes Baldwin [place], PA-C    Family History Family History  Problem Relation Age of Onset   Heart disease Maternal Grandfather    Heart disease Father        RBBB   Prostate cancer Father    Healthy Maternal Grandmother    Healthy Paternal Grandmother    Healthy Paternal Grandfather    Prostate cancer Paternal  Grandfather    Healthy Mother    Celiac disease Mother    Stomach cancer Neg Hx    Colon cancer Neg Hx     Social History Social History   Tobacco Use   Smoking status: Former    Packs/day: 1.00    Years: 2.00    Total pack years: 2.00    Types: Cigarettes   Smokeless tobacco: Never   Tobacco comments:    quit in 1999, only smoked for 2 years  Vaping Use   Vaping Use: Never used  Substance Use Topics   Alcohol use: Yes    Alcohol/week: 0.0 standard drinks of alcohol    Comment: Occasionally   Drug use: No     Allergies   Other   Review of Systems Review of Systems  All other systems reviewed and are negative.    Physical Exam Triage Vital Signs ED Triage Vitals  Enc Vitals Group     BP 05/22/22 0932 (!) 144/93     Pulse Rate 05/22/22 0932 88     Resp 05/22/22 0932 18     Temp 05/22/22 0932 98.9 F (37.2 C)     Temp Source 05/22/22 0932 Oral     SpO2 05/22/22 0932 100 %     Weight 05/22/22 0933 169 lb (76.7 kg)     Height 05/22/22 0933 6' (1.829 m)     Head Circumference --      Peak Flow --      Pain Score 05/22/22 0933 8     Pain Loc --      Pain Edu? --      Excl. in [place]? --    No data found.  Updated Vital Signs BP (!) 144/93 (BP Location: Right Arm)   Pulse 88   Temp 98.9 F (37.2 C) (Oral)   Resp 18   Ht 6' (1.829 m)   Wt 76.7 kg   SpO2 100%   BMI 22.92 kg/m   Visual Acuity Right Eye Distance:   Left Eye Distance:   Bilateral Distance:    Right Eye Near:   Left Eye Near:    Bilateral Near:     Physical Exam Vitals and nursing note reviewed.  Constitutional:      Appearance: He is well-developed.  HENT:     Head: Normocephalic.  Cardiovascular:     Rate and Rhythm: Normal rate.  Pulmonary:     Effort: Pulmonary effort is normal.  Abdominal:     General: There is no distension.  Musculoskeletal:        General: Normal range of motion.     Cervical back: Normal range of motion.     Comments: Swollen left second toe pain  with range of motion  Skin:    General: Skin is warm.  Neurological:     General: No focal deficit present.     Mental Status:  He is alert and oriented to person, place, and time.      UC Treatments / Results  Labs (all labs ordered are listed, but only abnormal results are displayed) Labs Reviewed - No data to display  EKG   Radiology No results found.  Procedures Procedures (including critical care time)  Medications Ordered in UC Medications  methylPREDNISolone sodium succinate (SOLU-MEDROL) 125 mg/2 mL injection 125 mg (has no administration in time range)    Initial Impression / Assessment and Plan / UC Course  I have reviewed the triage vital signs and the nursing notes.  Pertinent labs & imaging results that were available during my care of the patient were reviewed by me and considered in my medical decision making (see chart for details).     An After Visit Summary was printed and given to the patient.  Final Clinical Impressions(s) / UC Diagnoses   Final diagnoses:  Acute gout of left foot, unspecified cause     Discharge Instructions      Return if any problems.    ED Prescriptions     Medication Sig Dispense Auth. Provider   colchicine 0.6 MG tablet Take 1 tablet (0.6 mg total) by mouth daily. 30 tablet Fransico Meadow, [place]      PDMP not reviewed this encounter.   Fransico Meadow, [place] 05/22/22 1014

## 2022-05-22 NOTE — Discharge Instructions (Signed)
Return if any problems.

## 2022-05-22 NOTE — ED Triage Notes (Signed)
Patient c/o left foot pain x 1 day.  The pain has radiated up to ankle.  No injury.  History of gout.  Patient has taken Tylenol Arthritis and his Allopurinol today.

## 2022-05-23 ENCOUNTER — Telehealth: Payer: Self-pay | Admitting: Emergency Medicine

## 2022-05-23 NOTE — Telephone Encounter (Signed)
Spoke with patient states that he is doing well, still having some pain but will continue medication.  Any problems, will follow up as needed.

## 2022-07-28 ENCOUNTER — Ambulatory Visit (INDEPENDENT_AMBULATORY_CARE_PROVIDER_SITE_OTHER): Payer: Managed Care, Other (non HMO) | Admitting: Nurse Practitioner

## 2022-07-28 ENCOUNTER — Encounter: Payer: Self-pay | Admitting: Nurse Practitioner

## 2022-07-28 VITALS — BP 117/85 | HR 104 | Ht 72.0 in | Wt 172.1 lb

## 2022-07-28 DIAGNOSIS — J0140 Acute pansinusitis, unspecified: Secondary | ICD-10-CM | POA: Diagnosis not present

## 2022-07-28 DIAGNOSIS — M1A0790 Idiopathic chronic gout, unspecified ankle and foot, without tophus (tophi): Secondary | ICD-10-CM | POA: Diagnosis not present

## 2022-07-28 DIAGNOSIS — J301 Allergic rhinitis due to pollen: Secondary | ICD-10-CM | POA: Diagnosis not present

## 2022-07-28 MED ORDER — AZITHROMYCIN 250 MG PO TABS: ORAL_TABLET | ORAL | 0 refills | Status: DC

## 2022-07-28 NOTE — Assessment & Plan Note (Signed)
>>  ASSESSMENT AND PLAN FOR GOUT WRITTEN ON 07/28/2022  3:18 PM BY BOSCIA, HEATHER E, NP  Patient generally takes allopurinol daily to prevent gout flare. He was advised to hold colchicine for any acute gout flare until after he completes z-pack due to possibility of colchicine toxicity. He voiced understanding and agreement.

## 2022-07-28 NOTE — Progress Notes (Signed)
Established patient visit   Patient: Daniel Orozco   DOB: 08/01/1970   52 y.o. Male  MRN: [id number] Visit Date: 07/28/2022  Chief Complaint  Patient presents with   Sinus Problem   Subjective    Sinusitis This is a recurrent problem. The current episode started in the past 7 days. The problem is unchanged. There has been no fever. Associated symptoms include congestion, coughing, ear pain, headaches, neck pain, sinus pressure, sneezing, a sore throat and swollen glands. Pertinent negatives include no chills, diaphoresis or shortness of breath. Past treatments include nasal decongestants. The treatment provided mild relief.    Patient states that he does have significant seasonal allergic rhinitis.   Medications: Outpatient Medications Prior to Visit  Medication Sig   allopurinol (ZYLOPRIM) 100 MG tablet Take 1 tablet (100 mg total) by mouth daily.   colchicine 0.6 MG tablet Take 1 tablet (0.6 mg total) by mouth daily.   flecainide (TAMBOCOR) 100 MG tablet Take 300 mg by mouth as needed (if Afib).   ibuprofen (ADVIL) 200 MG tablet Take 400 mg by mouth every 6 (six) hours as needed for fever, headache or mild pain.   metoprolol succinate (TOPROL XL) 25 MG 24 hr tablet Take 1 tablet (25 mg total) by mouth daily as needed (FOR AFIB WHEN  TAKING FLECAINIDE).   Facility-Administered Medications Prior to Visit  Medication Dose Route Frequency Provider   0.9 %  sodium chloride infusion  500 mL Intravenous Once Nandigam, Eleonore Chiquito, MD    Review of Systems  Constitutional:  Negative for chills and diaphoresis.  HENT:  Positive for congestion, ear pain, sinus pressure, sneezing and sore throat.   Respiratory:  Positive for cough. Negative for shortness of breath.   Musculoskeletal:  Positive for neck pain.  Neurological:  Positive for headaches.     Objective     Today's Vitals   07/28/22 1444  BP: 117/85  Pulse: (Abnormal) 104  SpO2: 98%  Weight: 172 lb 1.6 oz (78.1 kg)   Height: 6' (1.829 m)   Body mass index is 23.34 kg/m.   Physical Exam Vitals and nursing note reviewed.  Constitutional:      Appearance: Normal appearance. He is not ill-appearing.  HENT:     Head: Normocephalic and atraumatic.     Right Ear: Ear canal and external ear normal. Tympanic membrane is erythematous and bulging.     Left Ear: Ear canal and external ear normal. Tympanic membrane is bulging.     Nose: Congestion present.     Right Sinus: Maxillary sinus tenderness and frontal sinus tenderness present.     Left Sinus: Frontal sinus tenderness present. No maxillary sinus tenderness.     Mouth/Throat:     Mouth: Mucous membranes are moist.     Pharynx: Oropharynx is clear. Posterior oropharyngeal erythema present.     Tonsils: No tonsillar exudate or tonsillar abscesses.   Eyes:     Extraocular Movements: Extraocular movements intact.     Conjunctiva/sclera: Conjunctivae normal.     Pupils: Pupils are equal, round, and reactive to light.  Cardiovascular:     Rate and Rhythm: Normal rate and regular rhythm.     Pulses: Normal pulses.     Heart sounds: Normal heart sounds.  Pulmonary:     Effort: Pulmonary effort is normal. No respiratory distress.     Breath sounds: Normal breath sounds.  Musculoskeletal:        General: Normal range of motion.  Cervical back: Normal range of motion and neck supple.  Lymphadenopathy:     Cervical: Cervical adenopathy present.  Skin:    General: Skin is warm and dry.     Capillary Refill: Capillary refill takes less than 2 seconds.  Neurological:     General: No focal deficit present.     Mental Status: He is alert and oriented to person, place, and time.  Psychiatric:        Mood and Affect: Mood normal.        Behavior: Behavior normal.        Thought Content: Thought content normal.        Judgment: Judgment normal.      Assessment & Plan    Acute non-recurrent pansinusitis Assessment & Plan: Start z-pack. Take as  directed for 5 days. Rest and increase fluids. Continue using OTC medication to control symptoms.    Orders: -     Azithromycin; z-pack - take as directed for 5 days  Dispense: 6 tablet; Refill: 0  Chronic gout of ankle, unspecified cause, unspecified laterality Assessment & Plan: Patient generally takes allopurinol daily to prevent gout flare. He was advised to hold colchicine for any acute gout flare until after he completes z-pack due to possibility of colchicine toxicity. He voiced understanding and agreement.     Seasonal allergic rhinitis due to pollen     Return for prn worsening or persistent symptoms.        Carlean Jews, NP  [facility] at [facility] [phone] (phone) [phone] (fax)  [facility]

## 2022-07-28 NOTE — Assessment & Plan Note (Signed)
Patient generally takes allopurinol daily to prevent gout flare. He was advised to hold colchicine for any acute gout flare until after he completes z-pack due to possibility of colchicine toxicity. He voiced understanding and agreement.

## 2022-07-28 NOTE — Assessment & Plan Note (Signed)
Patient to continue using flonase nasal spray as indicated. Recommend he try OTC claritin or zyrtec.

## 2022-07-28 NOTE — Assessment & Plan Note (Signed)
Start z-pack. Take as directed for 5 days. Rest and increase fluids. Continue using OTC medication to control symptoms.

## 2022-08-04 ENCOUNTER — Ambulatory Visit
Admission: EM | Admit: 2022-08-04 | Discharge: 2022-08-04 | Disposition: A | Discharge status: Home / Self Care | Payer: Managed Care, Other (non HMO) | Attending: Emergency Medicine | Admitting: Emergency Medicine

## 2022-08-04 DIAGNOSIS — M25562 Pain in left knee: Secondary | ICD-10-CM | POA: Diagnosis not present

## 2022-08-04 DIAGNOSIS — M25462 Effusion, left knee: Secondary | ICD-10-CM | POA: Diagnosis not present

## 2022-08-04 MED ORDER — SULFAMETHOXAZOLE-TRIMETHOPRIM 800-160 MG PO TABS: 1.0000 | ORAL_TABLET | Freq: Two times a day (BID) | ORAL | 0 refills | Status: AC

## 2022-08-04 MED ORDER — PREDNISONE 10 MG (21) PO TBPK: ORAL_TABLET | Freq: Every day | ORAL | 0 refills | Status: DC

## 2022-08-04 NOTE — ED Triage Notes (Signed)
Pt c/o gelatin mass to lateral side of left knee first noticed ~ yesterday.

## 2022-08-04 NOTE — Discharge Instructions (Addendum)
Discussed with patient he will need to follow-up with orthopedics for further testing and rule out tendinitis or arthritis  Discussed with patient about signs of cellulitis will give an antibiotic for patient not to start unless notices signs of red streaking warm to touch. Keep leg Elevated use warm compresses.

## 2022-08-04 NOTE — ED Provider Notes (Signed)
[facility]    CSN: [id number] Arrival date & time: 08/04/22  0803      History   Chief Complaint Chief Complaint  Patient presents with   gelatin mass    HPI Daniel Orozco is a 52 y.o. male.   Patient presents today with left knee pain and swelling after playing golf on Saturday.  Patient states that he does have a history of gout but this is not similar to symptoms.  Patient states that he noticed this something that appears gelatin mass or fluid lateral side of knee.  Denies any calf pain no shortness of breath no chest pain.  The swelling does go down in the morning.    Past Medical History:  Diagnosis Date   Allergy    Arthritis    Blood transfusion without reported diagnosis    Crohn disease    in remission since 2000   Paroxysmal atrial fibrillation    S/P balloon dilatation of esophageal stricture     Patient Active Problem List   Diagnosis Date Noted   Acute non-recurrent pansinusitis 07/28/2022   Encounter for wellness examination 10/13/2017   Screening for multiple conditions 08/24/2017   Environmental and seasonal allergies 08/24/2017   Gouty arthropathy 08/24/2017   Non-celiac gluten sensitivity 08/24/2017   Family history of prostate cancer in father 08/24/2017   Routine adult health maintenance 11/21/2016   A-fib 07/03/2015   Gout 06/26/2015   PAF (paroxysmal atrial fibrillation) 06/26/2015   Atrial fibrillation with RVR 03/27/2015   Chronic daily headache 02/23/2014   Mood disorder (HCC)-  history of anxiety depression and ADHD 02/23/2014   Snoring 02/23/2014   Vision changes 02/23/2014   GERD 08/07/2009   CROHN'S DISEASE-SMALL INTESTINE 08/07/2009   WEIGHT LOSS-ABNORMAL 08/07/2009   NAUSEA WITH VOMITING 08/07/2009   NAUSEA ALONE 08/07/2009   ABDOMINAL PAIN-PERIUMBILICAL 08/07/2009   ABDOMINAL PAIN-EPIGASTRIC 08/07/2009   ABDOMINAL PAIN, UPPER 08/07/2009   ADHD 08/19/2007   HAY FEVER 08/19/2007   Allergic rhinitis  08/19/2007   INGUINAL HERNIA, RIGHT 08/19/2007   Asthma 06/22/2007   CROHN'S DISEASE 06/22/2007   DYSPHAGIA UNSPECIFIED 06/22/2007   BENIGN NEOPLASM OTH&UNSPEC SITE DIGESTIVE SYSTEM 03/01/2007   ESOPHAGEAL STRICTURE 01/25/2007   PAIN IN JOINT, ANKLE/FOOT 08/07/2006   HALLUX RIGIDUS, ACQUIRED 08/07/2006   HIATAL HERNIA 11/15/2005   FOREIGN BODY IN ESOPHAGUS 11/15/2005    Past Surgical History:  Procedure Laterality Date   ABLATION     CARDIOVERSION N/A 03/28/2015   Procedure: CARDIOVERSION;  Surgeon: Lars Masson, MD;  Location: [facility] ENDOSCOPY;  Service: Cardiovascular;  Laterality: N/A;   ELECTROPHYSIOLOGIC STUDY N/A 07/03/2015   atrial fibrillation ablation by Dr Johney Frame   ESOPHAGEAL DILATION     FRACTURE SURGERY     TEE WITHOUT CARDIOVERSION N/A 03/28/2015   Procedure: TRANSESOPHAGEAL ECHOCARDIOGRAM (TEE);  Surgeon: Lars Masson, MD;  Location: [facility] ENDOSCOPY;  Service: Cardiovascular;  Laterality: N/A;       Home Medications    Prior to Admission medications   Medication Sig Start Date End Date Taking? Authorizing Provider  predniSONE (STERAPRED UNI-PAK 21 TAB) 10 MG (21) TBPK tablet Take by mouth daily. Take 6 tabs by mouth daily  for 2 days, then 5 tabs for 2 days, then 4 tabs for 2 days, then 3 tabs for 2 days, 2 tabs for 2 days, then 1 tab by mouth daily for 2 days 08/04/22  Yes Coralyn Mark, NP  sulfamethoxazole-trimethoprim (BACTRIM DS) 800-160 MG tablet Take 1 tablet by  mouth 2 (two) times daily for 7 days. 08/04/22 08/11/22 Yes Coralyn Mark, NP  allopurinol (ZYLOPRIM) 100 MG tablet Take 1 tablet (100 mg total) by mouth daily. 04/23/22   Carlean Jews, NP  azithromycin (ZITHROMAX) 250 MG tablet z-pack - take as directed for 5 days 07/28/22   Carlean Jews, NP  colchicine 0.6 MG tablet Take 1 tablet (0.6 mg total) by mouth daily. 05/22/22 05/22/23  Elson Areas, PA-C  flecainide (TAMBOCOR) 100 MG tablet Take 300 mg by mouth as needed (if Afib).     [provider]  ibuprofen (ADVIL) 200 MG tablet Take 400 mg by mouth every 6 (six) hours as needed for fever, headache or mild pain.    [provider]  metoprolol succinate (TOPROL XL) 25 MG 24 hr tablet Take 1 tablet (25 mg total) by mouth daily as needed (FOR AFIB WHEN  TAKING FLECAINIDE). 11/20/21   Sheilah Pigeon, PA-C    Family History Family History  Problem Relation Age of Onset   Heart disease Maternal Grandfather    Heart disease Father        RBBB   Prostate cancer Father    Healthy Maternal Grandmother    Healthy Paternal Grandmother    Healthy Paternal Grandfather    Prostate cancer Paternal Grandfather    Healthy Mother    Celiac disease Mother    Stomach cancer Neg Hx    Colon cancer Neg Hx     Social History Social History   Tobacco Use   Smoking status: Former    Packs/day: 1.00    Years: 2.00    Additional pack years: 0.00    Total pack years: 2.00    Types: Cigarettes   Smokeless tobacco: Never   Tobacco comments:    quit in 1999, only smoked for 2 years  Vaping Use   Vaping Use: Never used  Substance Use Topics   Alcohol use: Yes    Alcohol/week: 0.0 standard drinks of alcohol    Comment: Occasionally   Drug use: No     Allergies   Other   Review of Systems Review of Systems  Constitutional: Negative.   Respiratory: Negative.    Cardiovascular: Negative.   Musculoskeletal:  Positive for neck pain.  Skin:        Swelling to left side of knee left knee pain to touch  Neurological: Negative.  Negative for numbness.     Physical Exam Triage Vital Signs ED Triage Vitals [08/04/22 0811]  Enc Vitals Group     BP 130/88     Pulse Rate 99     Resp 16     Temp 98.3 F (36.8 C)     Temp Source Oral     SpO2 98 %     Weight      Height      Head Circumference      Peak Flow      Pain Score 5     Pain Loc      Pain Edu?      Excl. in GC?    No data found.  Updated Vital Signs BP 130/88 (BP Location: Right  Arm)   Pulse 99   Temp 98.3 F (36.8 C) (Oral)   Resp 16   SpO2 98%   Visual Acuity Right Eye Distance:   Left Eye Distance:   Bilateral Distance:    Right Eye Near:   Left Eye Near:    Bilateral  Near:     Physical Exam Constitutional:      Appearance: Normal appearance.  Musculoskeletal:        General: Swelling and tenderness present. No signs of injury. Normal range of motion.     Comments: Left lateral +1 pitting edema.  No erythema noted.  Full range of motion tenderness to palpation lateral side.  Strong pulses negative Homan.  Skin:    Capillary Refill: Capillary refill takes less than 2 seconds.     Findings: No erythema.  Neurological:     General: No focal deficit present.     Mental Status: He is alert.      UC Treatments / Results  Labs (all labs ordered are listed, but only abnormal results are displayed) Labs Reviewed - No data to display  EKG   Radiology No results found.  Procedures Procedures (including critical care time)  Medications Ordered in UC Medications - No data to display  Initial Impression / Assessment and Plan / UC Course  I have reviewed the triage vital signs and the nursing notes.  Pertinent labs & imaging results that were available during my care of the patient were reviewed by me and considered in my medical decision making (see chart for details).     Will start on prednisone Discussed with patient he will need to follow-up with orthopedics for further testing and rule out tendinitis or arthritis Patient has previously seen [facility] would like to follow-up with them Discussed with patient about signs of cellulitis will give an antibiotic for patient not to start unless notices signs of red streaking warm to touch. Keep leg Elevated use warm compresses. Final Clinical Impressions(s) / UC Diagnoses   Final diagnoses:  Acute pain of left knee  Knee effusion, left     Discharge Instructions      Discussed  with patient he will need to follow-up with orthopedics for further testing and rule out tendinitis or arthritis  Discussed with patient about signs of cellulitis will give an antibiotic for patient not to start unless notices signs of red streaking warm to touch. Keep leg Elevated use warm compresses.     ED Prescriptions     Medication Sig Dispense Auth. Provider   sulfamethoxazole-trimethoprim (BACTRIM DS) 800-160 MG tablet Take 1 tablet by mouth 2 (two) times daily for 7 days. 14 tablet Maple Mirza L, NP   predniSONE (STERAPRED UNI-PAK 21 TAB) 10 MG (21) TBPK tablet Take by mouth daily. Take 6 tabs by mouth daily  for 2 days, then 5 tabs for 2 days, then 4 tabs for 2 days, then 3 tabs for 2 days, 2 tabs for 2 days, then 1 tab by mouth daily for 2 days 42 tablet Coralyn Mark, NP      PDMP not reviewed this encounter.   Coralyn Mark, NP 08/04/22 [phone]

## 2022-08-13 ENCOUNTER — Encounter: Payer: Self-pay | Admitting: Gastroenterology

## 2022-08-14 ENCOUNTER — Ambulatory Visit: Payer: Managed Care, Other (non HMO) | Admitting: Family

## 2022-10-13 ENCOUNTER — Other Ambulatory Visit: Payer: Self-pay

## 2022-10-13 DIAGNOSIS — Z1321 Encounter for screening for nutritional disorder: Secondary | ICD-10-CM

## 2022-10-13 DIAGNOSIS — Z125 Encounter for screening for malignant neoplasm of prostate: Secondary | ICD-10-CM

## 2022-10-13 DIAGNOSIS — Z130 Encounter for screening for diseases of the blood and blood-forming organs and certain disorders involving the immune mechanism: Secondary | ICD-10-CM

## 2022-10-13 DIAGNOSIS — Z0000 Encounter for general adult medical examination without abnormal findings: Secondary | ICD-10-CM

## 2022-10-14 ENCOUNTER — Other Ambulatory Visit: Payer: Managed Care, Other (non HMO)

## 2022-10-14 DIAGNOSIS — Z125 Encounter for screening for malignant neoplasm of prostate: Secondary | ICD-10-CM

## 2022-10-14 DIAGNOSIS — Z0000 Encounter for general adult medical examination without abnormal findings: Secondary | ICD-10-CM

## 2022-10-14 DIAGNOSIS — Z130 Encounter for screening for diseases of the blood and blood-forming organs and certain disorders involving the immune mechanism: Secondary | ICD-10-CM

## 2022-10-15 LAB — CBC WITH DIFFERENTIAL/PLATELET
Basophils Absolute: 0.1 10*3/uL (ref 0.0–0.2)
Basos: 2 %
EOS (ABSOLUTE): 0.2 10*3/uL (ref 0.0–0.4)
Eos: 5 %
Hematocrit: 42 % (ref 37.5–51.0)
Hemoglobin: 13.7 g/dL (ref 13.0–17.7)
Immature Grans (Abs): 0 10*3/uL (ref 0.0–0.1)
Immature Granulocytes: 0 %
Lymphocytes Absolute: 0.9 10*3/uL (ref 0.7–3.1)
Lymphs: 25 %
MCH: 29.2 pg (ref 26.6–33.0)
MCHC: 32.6 g/dL (ref 31.5–35.7)
MCV: 90 fL (ref 79–97)
Monocytes Absolute: 0.4 10*3/uL (ref 0.1–0.9)
Monocytes: 10 %
Neutrophils Absolute: 2.2 10*3/uL (ref 1.4–7.0)
Neutrophils: 58 %
Platelets: 255 10*3/uL (ref 150–450)
RBC: 4.69 x10E6/uL (ref 4.14–5.80)
RDW: 13 % (ref 11.6–15.4)
WBC: 3.8 10*3/uL (ref 3.4–10.8)

## 2022-10-15 LAB — COMPREHENSIVE METABOLIC PANEL
ALT: 23 IU/L (ref 0–44)
AST: 18 IU/L (ref 0–40)
Albumin: 4.6 g/dL (ref 3.8–4.9)
Alkaline Phosphatase: 71 IU/L (ref 44–121)
BUN/Creatinine Ratio: 11 (ref 9–20)
BUN: 9 mg/dL (ref 6–24)
Bilirubin Total: 1.6 mg/dL — ABNORMAL HIGH (ref 0.0–1.2)
CO2: 25 mmol/L (ref 20–29)
Calcium: 10.1 mg/dL (ref 8.7–10.2)
Chloride: 103 mmol/L (ref 96–106)
Creatinine, Ser: 0.82 mg/dL (ref 0.76–1.27)
Globulin, Total: 2 g/dL (ref 1.5–4.5)
Glucose: 98 mg/dL (ref 70–99)
Potassium: 4.2 mmol/L (ref 3.5–5.2)
Sodium: 143 mmol/L (ref 134–144)
Total Protein: 6.6 g/dL (ref 6.0–8.5)
eGFR: 105 mL/min/{1.73_m2} (ref >59)

## 2022-10-15 LAB — PSA: Prostate Specific Ag, Serum: 0.3 ng/mL (ref 0.0–4.0)

## 2022-10-15 LAB — LIPID PANEL
Chol/HDL Ratio: 4.5 ratio (ref 0.0–5.0)
Cholesterol, Total: 202 mg/dL — ABNORMAL HIGH (ref 100–199)
HDL: 45 mg/dL (ref >39)
LDL Chol Calc (NIH): 124 mg/dL — ABNORMAL HIGH (ref 0–99)
Triglycerides: 185 mg/dL — ABNORMAL HIGH (ref 0–149)
VLDL Cholesterol Cal: 33 mg/dL (ref 5–40)

## 2022-10-15 LAB — HEMOGLOBIN A1C
Est. average glucose Bld gHb Est-mCnc: 94 mg/dL
Hgb A1c MFr Bld: 4.9 % (ref 4.8–5.6)

## 2022-10-15 LAB — TSH: TSH: 1.61 u[IU]/mL (ref 0.450–4.500)

## 2022-10-22 ENCOUNTER — Encounter: Payer: Self-pay | Admitting: Family Medicine

## 2022-10-22 ENCOUNTER — Ambulatory Visit: Payer: Managed Care, Other (non HMO) | Admitting: Family Medicine

## 2022-10-22 VITALS — BP 127/81 | HR 105 | Resp 18 | Ht 72.0 in | Wt 173.0 lb

## 2022-10-22 DIAGNOSIS — Z23 Encounter for immunization: Secondary | ICD-10-CM | POA: Diagnosis not present

## 2022-10-22 DIAGNOSIS — M109 Gout, unspecified: Secondary | ICD-10-CM | POA: Diagnosis not present

## 2022-10-22 DIAGNOSIS — Z1211 Encounter for screening for malignant neoplasm of colon: Secondary | ICD-10-CM

## 2022-10-22 DIAGNOSIS — Z1159 Encounter for screening for other viral diseases: Secondary | ICD-10-CM

## 2022-10-22 DIAGNOSIS — Z0000 Encounter for general adult medical examination without abnormal findings: Secondary | ICD-10-CM

## 2022-10-22 MED ORDER — COLCHICINE 0.6 MG PO TABS
0.6000 mg | ORAL_TABLET | Freq: Every day | ORAL | 2 refills | Status: DC
Start: 2022-10-22 — End: 2023-03-02

## 2022-10-22 MED ORDER — ALLOPURINOL 100 MG PO TABS: 100.0000 mg | ORAL_TABLET | Freq: Every day | ORAL | 1 refills | Status: DC

## 2022-10-22 NOTE — Assessment & Plan Note (Signed)
Continue allopurinol 100 mg daily for prophylaxis and colchicine 0.6 mg daily for acute flares.  Will continue to monitor.

## 2022-10-22 NOTE — Progress Notes (Signed)
Complete physical exam  Patient: Daniel Orozco   DOB: March 12, 1971   52 y.o. Male  MRN: [id number]  Subjective:    Chief Complaint  Patient presents with   Annual Exam    Daniel Orozco is a 52 y.o. male who presents today for a complete physical exam. He reports consuming a general diet.  He generally feels well. He reports sleeping well. He does not have additional problems to discuss today.    Most recent fall risk assessment:    09/18/2020    2:11 PM  Fall Risk   Falls in the past year? 0  Number falls in past yr: 0  Injury with Fall? 0  Risk for fall due to : No Fall Risks  Follow up Falls evaluation completed     Most recent depression and anxiety screenings:    04/23/2022    3:17 PM 09/18/2020    2:12 PM  PHQ 2/9 Scores  PHQ - 2 Score 0 0  PHQ- 9 Score 0 0      04/23/2022    3:18 PM 09/18/2020    2:12 PM 09/23/2018    3:46 PM  GAD 7 : Generalized Anxiety Score  Nervous, Anxious, on Edge 1 0 0  Control/stop worrying 0 0 0  Worry too much - different things 0 0 0  Trouble relaxing 0 0 0  Restless 0 0 0  Easily annoyed or irritable 0 0 0  Afraid - awful might happen 0 0 0  Total GAD 7 Score 1 0 0  Anxiety Difficulty   Not difficult at all    Patient Active Problem List   Diagnosis Date Noted   Environmental and seasonal allergies 08/24/2017   Non-celiac gluten sensitivity 08/24/2017   Family history of prostate cancer in father 08/24/2017   PAF (paroxysmal atrial fibrillation) (HCC) 06/26/2015   Gouty arthropathy 06/26/2015   Atrial fibrillation with RVR (HCC) 03/27/2015   Chronic daily headache 02/23/2014   Mood disorder (HCC)-  history of anxiety depression and ADHD 02/23/2014   Snoring 02/23/2014   GERD 08/07/2009   ADHD 08/19/2007   Allergic rhinitis 08/19/2007   INGUINAL HERNIA, RIGHT 08/19/2007   Asthma 06/22/2007   ESOPHAGEAL STRICTURE 01/25/2007   HIATAL HERNIA 11/15/2005    Past Surgical History:  Procedure Laterality Date   ABLATION      CARDIOVERSION N/A 03/28/2015   Procedure: CARDIOVERSION;  Surgeon: Lars Masson, MD;  Location: [facility] ENDOSCOPY;  Service: Cardiovascular;  Laterality: N/A;   ELECTROPHYSIOLOGIC STUDY N/A 07/03/2015   atrial fibrillation ablation by Dr Johney Frame   ESOPHAGEAL DILATION     FRACTURE SURGERY     TEE WITHOUT CARDIOVERSION N/A 03/28/2015   Procedure: TRANSESOPHAGEAL ECHOCARDIOGRAM (TEE);  Surgeon: Lars Masson, MD;  Location: [facility] ENDOSCOPY;  Service: Cardiovascular;  Laterality: N/A;   Social History   Tobacco Use   Smoking status: Former    Packs/day: 1.00    Years: 2.00    Additional pack years: 0.00    Total pack years: 2.00    Types: Cigarettes    Passive exposure: Past   Smokeless tobacco: Never   Tobacco comments:    quit in 1999, only smoked for 2 years  Vaping Use   Vaping Use: Never used  Substance Use Topics   Alcohol use: Yes    Alcohol/week: 0.0 standard drinks of alcohol    Comment: Occasionally   Drug use: No   Family History  Problem Relation Age of Onset  Heart disease Maternal Grandfather    Heart disease Father        RBBB   Prostate cancer Father    Healthy Maternal Grandmother    Healthy Paternal Grandmother    Healthy Paternal Grandfather    Prostate cancer Paternal Grandfather    Healthy Mother    Celiac disease Mother    Stomach cancer Neg Hx    Colon cancer Neg Hx    Allergies  Allergen Reactions   Other Swelling    Honey dew melon and cantaloupe causes throat swelling      Patient Care Team: Melida Quitter, PA as PCP - General (Family Medicine) Hillis Range, MD (Inactive) as Consulting Physician (Cardiology) Napoleon Form, MD as Consulting Physician (Gastroenterology) Lyn Records, MD (Inactive) as Consulting Physician (Cardiology)   Outpatient Medications Prior to Visit  Medication Sig   flecainide (TAMBOCOR) 100 MG tablet Take 300 mg by mouth as needed (if Afib).   ibuprofen (ADVIL) 200 MG tablet Take 400 mg by mouth  every 6 (six) hours as needed for fever, headache or mild pain.   metoprolol succinate (TOPROL XL) 25 MG 24 hr tablet Take 1 tablet (25 mg total) by mouth daily as needed (FOR AFIB WHEN  TAKING FLECAINIDE).   [DISCONTINUED] allopurinol (ZYLOPRIM) 100 MG tablet Take 1 tablet (100 mg total) by mouth daily.   [DISCONTINUED] colchicine 0.6 MG tablet Take 1 tablet (0.6 mg total) by mouth daily.   [DISCONTINUED] azithromycin (ZITHROMAX) 250 MG tablet z-pack - take as directed for 5 days (Patient not taking: Reported on 10/22/2022)   [DISCONTINUED] predniSONE (STERAPRED UNI-PAK 21 TAB) 10 MG (21) TBPK tablet Take by mouth daily. Take 6 tabs by mouth daily  for 2 days, then 5 tabs for 2 days, then 4 tabs for 2 days, then 3 tabs for 2 days, 2 tabs for 2 days, then 1 tab by mouth daily for 2 days (Patient not taking: Reported on 10/22/2022)   Facility-Administered Medications Prior to Visit  Medication Dose Route Frequency Provider   0.9 %  sodium chloride infusion  500 mL Intravenous Once Nandigam, Eleonore Chiquito, MD    Review of Systems  Constitutional:  Negative for chills, fever and malaise/fatigue.  HENT:  Negative for congestion and hearing loss.   Eyes:  Negative for blurred vision and double vision.  Respiratory:  Negative for cough and shortness of breath.   Cardiovascular:  Negative for chest pain, palpitations and leg swelling.  Gastrointestinal:  Negative for abdominal pain, constipation, diarrhea and heartburn.  Genitourinary:  Negative for frequency and urgency.  Musculoskeletal:  Negative for myalgias and neck pain.  Neurological:  Negative for headaches.  Endo/Heme/Allergies:  Negative for polydipsia.  Psychiatric/Behavioral:  Negative for depression. The patient is not nervous/anxious and does not have insomnia.       Objective:    BP 127/81 (BP Location: Left Arm, Patient Position: Sitting, Cuff Size: Normal)   Pulse (!) 105   Resp 18   Ht 6' (1.829 m)   Wt 173 lb (78.5 kg)   SpO2  99%   BMI 23.46 kg/m    Physical Exam Constitutional:      General: He is not in acute distress.    Appearance: Normal appearance.  HENT:     Head: Normocephalic and atraumatic.     Right Ear: Tympanic membrane, ear canal and external ear normal.     Left Ear: Tympanic membrane, ear canal and external ear normal.  Nose: Nose normal. No congestion or rhinorrhea.     Mouth/Throat:     Mouth: Mucous membranes are moist.     Pharynx: Oropharynx is clear.  Eyes:     Conjunctiva/sclera: Conjunctivae normal.     Pupils: Pupils are equal, round, and reactive to light.     Comments: Wears glasses, up-to-date  Neck:     Thyroid: No thyroid mass, thyromegaly or thyroid tenderness.  Cardiovascular:     Rate and Rhythm: Normal rate and regular rhythm.     Heart sounds: Normal heart sounds. No murmur heard.    No friction rub. No gallop.  Pulmonary:     Effort: Pulmonary effort is normal. No respiratory distress.     Breath sounds: Normal breath sounds. No wheezing, rhonchi or rales.  Abdominal:     General: Bowel sounds are normal.     Palpations: Abdomen is soft.  Musculoskeletal:        General: Normal range of motion.     Cervical back: Normal range of motion and neck supple.  Lymphadenopathy:     Cervical: No cervical adenopathy.  Skin:    General: Skin is warm and dry.  Neurological:     Mental Status: He is alert and oriented to person, place, and time.     Cranial Nerves: No cranial nerve deficit.     Motor: No weakness.     Deep Tendon Reflexes: Reflexes normal.  Psychiatric:        Mood and Affect: Mood normal.       Assessment & Plan:    Routine Health Maintenance and Physical Exam  Immunization History  Administered Date(s) Administered   Influenza Inj Mdck Quad Pf 12/30/2017   Influenza,inj,Quad PF,6+ Mos 01/22/2017, 12/01/2018, 04/23/2022   Influenza-Unspecified 12/01/2018   PFIZER(Purple Top)SARS-COV-2 Vaccination 06/20/2019, 07/13/2019, 02/09/2020    Tdap 10/22/2022    Health Maintenance  Topic Date Due   HIV Screening  Never done   Hepatitis C Screening  Never done   Zoster Vaccines- Shingrix (1 of 2) Never done   COVID-19 Vaccine (4 - 2023-24 season) 12/13/2021   Colonoscopy  06/25/2022   INFLUENZA VACCINE  11/13/2022   DTaP/Tdap/Td (2 - Td or Tdap) 10/21/2032   HPV VACCINES  Aged Out   Discussed need for updated colonoscopy, he would like me to send in a referral so that he can have that done. Due for tetanus and shingles vaccines.  He will get tetanus shot today.  He will schedule a nurse visit or go to his local pharmacy to get his first shingles shot at a time that is more convenient for him in case he does have any flulike symptoms afterward. He is agreeable to having HIV and hepatitis C screening labs drawn with next round of blood work.  Discussed health benefits of physical activity, and encouraged him to engage in regular exercise appropriate for his age and condition.  Wellness examination  Gouty arthropathy Assessment & Plan: Continue allopurinol 100 mg daily for prophylaxis and colchicine 0.6 mg daily for acute flares.  Will continue to monitor.  Orders: -     Allopurinol; Take 1 tablet (100 mg total) by mouth daily.  Dispense: 90 tablet; Refill: 1 -     Colchicine; Take 1 tablet (0.6 mg total) by mouth daily.  Dispense: 30 tablet; Refill: 2  Need for Tdap vaccination -     Tdap vaccine greater than or equal to 7yo IM  Screening for colorectal cancer -  Ambulatory referral to Gastroenterology    Return in about 6 months (around 04/24/2023) for follow-up for gout, fasting blood work 1 week before.     Melida Quitter, PA

## 2022-10-22 NOTE — Patient Instructions (Signed)
Whenever works best for you to get your first shingles shot, you can either schedule a nurse visit with our office to have it done or you can go to your local pharmacy.

## 2022-11-03 ENCOUNTER — Ambulatory Visit (INDEPENDENT_AMBULATORY_CARE_PROVIDER_SITE_OTHER): Payer: Managed Care, Other (non HMO) | Admitting: Family Medicine

## 2022-11-03 VITALS — BP 122/84 | HR 84 | Ht 72.0 in | Wt 173.0 lb

## 2022-11-03 DIAGNOSIS — Z23 Encounter for immunization: Secondary | ICD-10-CM

## 2022-11-03 NOTE — Progress Notes (Signed)
Patient is her for his 1st Shingle vax pt tolerated injection

## 2023-03-02 ENCOUNTER — Other Ambulatory Visit: Payer: Self-pay | Admitting: Family Medicine

## 2023-03-02 DIAGNOSIS — M109 Gout, unspecified: Secondary | ICD-10-CM

## 2023-04-10 ENCOUNTER — Other Ambulatory Visit: Payer: Self-pay

## 2023-04-10 DIAGNOSIS — I10 Essential (primary) hypertension: Secondary | ICD-10-CM

## 2023-04-21 ENCOUNTER — Other Ambulatory Visit: Payer: Managed Care, Other (non HMO)

## 2023-04-21 DIAGNOSIS — Z1159 Encounter for screening for other viral diseases: Secondary | ICD-10-CM

## 2023-04-21 DIAGNOSIS — I10 Essential (primary) hypertension: Secondary | ICD-10-CM

## 2023-04-22 LAB — COMPREHENSIVE METABOLIC PANEL
ALT: 44 [IU]/L (ref 0–44)
AST: 27 [IU]/L (ref 0–40)
Albumin: 4.7 g/dL (ref 3.8–4.9)
Alkaline Phosphatase: 76 [IU]/L (ref 44–121)
BUN/Creatinine Ratio: 19 (ref 9–20)
BUN: 14 mg/dL (ref 6–24)
Bilirubin Total: 1.3 mg/dL — ABNORMAL HIGH (ref 0.0–1.2)
CO2: 27 mmol/L (ref 20–29)
Calcium: 9.8 mg/dL (ref 8.7–10.2)
Chloride: 102 mmol/L (ref 96–106)
Creatinine, Ser: 0.73 mg/dL — ABNORMAL LOW (ref 0.76–1.27)
Globulin, Total: 2.2 g/dL (ref 1.5–4.5)
Glucose: 106 mg/dL — ABNORMAL HIGH (ref 70–99)
Potassium: 4.3 mmol/L (ref 3.5–5.2)
Sodium: 142 mmol/L (ref 134–144)
Total Protein: 6.9 g/dL (ref 6.0–8.5)
eGFR: 109 mL/min/{1.73_m2} (ref >59)

## 2023-04-22 LAB — HIV ANTIBODY (ROUTINE TESTING W REFLEX): HIV Screen 4th Generation wRfx: NONREACTIVE

## 2023-04-22 LAB — LIPID PANEL
Chol/HDL Ratio: 5.1 {ratio} — ABNORMAL HIGH (ref 0.0–5.0)
Cholesterol, Total: 238 mg/dL — ABNORMAL HIGH (ref 100–199)
HDL: 47 mg/dL (ref >39)
LDL Chol Calc (NIH): 161 mg/dL — ABNORMAL HIGH (ref 0–99)
Triglycerides: 167 mg/dL — ABNORMAL HIGH (ref 0–149)
VLDL Cholesterol Cal: 30 mg/dL (ref 5–40)

## 2023-04-22 LAB — HEPATITIS C ANTIBODY: Hep C Virus Ab: NONREACTIVE

## 2023-04-23 ENCOUNTER — Other Ambulatory Visit: Payer: Self-pay | Admitting: Family Medicine

## 2023-04-23 DIAGNOSIS — M109 Gout, unspecified: Secondary | ICD-10-CM

## 2023-04-27 ENCOUNTER — Ambulatory Visit (INDEPENDENT_AMBULATORY_CARE_PROVIDER_SITE_OTHER): Payer: Managed Care, Other (non HMO) | Admitting: Family Medicine

## 2023-04-27 ENCOUNTER — Encounter: Payer: Self-pay | Admitting: Family Medicine

## 2023-04-27 VITALS — BP 114/78 | HR 93 | Temp 98.0°F | Ht 72.0 in | Wt 180.8 lb

## 2023-04-27 DIAGNOSIS — M109 Gout, unspecified: Secondary | ICD-10-CM | POA: Diagnosis not present

## 2023-04-27 DIAGNOSIS — Z1211 Encounter for screening for malignant neoplasm of colon: Secondary | ICD-10-CM

## 2023-04-27 DIAGNOSIS — Z1212 Encounter for screening for malignant neoplasm of rectum: Secondary | ICD-10-CM

## 2023-04-27 DIAGNOSIS — L639 Alopecia areata, unspecified: Secondary | ICD-10-CM | POA: Insufficient documentation

## 2023-04-27 MED ORDER — BETAMETHASONE DIPROPIONATE 0.05 % EX OINT: TOPICAL_OINTMENT | Freq: Every day | CUTANEOUS | 2 refills | Status: DC

## 2023-04-27 NOTE — Assessment & Plan Note (Signed)
 Initiate trial of a medicine dipropionate 0.05% ointment on areas of hair loss of the scalp.  Also referring to dermatology for further evaluation and management as needed.  We will follow-up in 3 months to assess efficacy of betamethasone  in encouraging hair regrowth.  At that time, if no hair regrowth then discontinue the topical corticosteroid and consider alternative therapies if not already established with dermatology.  Taper and discontinue topical corticosteroid therapy over the course of three to six months. If there is hair regrowth, continue the topical corticosteroid and gradually taper the frequency of application.

## 2023-04-27 NOTE — Assessment & Plan Note (Signed)
Continue allopurinol 100 mg daily for prophylaxis and colchicine 0.6 mg daily for acute flares.  Will continue to monitor.

## 2023-04-27 NOTE — Progress Notes (Addendum)
 Established Patient Office Visit  Subjective   Patient ID: Daniel Orozco, male    DOB: 20-Jul-1970  Age: 53 y.o. MRN: [id number]  Chief Complaint  Patient presents with   Follow up Gout   Follow Up Labs   Hair/Scalp Problem    HPI Corey A Pfarr is a 53 y.o. male presenting today for follow up of gout.  He is taking allopurinol  100 mg daily for prophylaxis.  He has colchicine  0.6 mg to use for acute flares.  He reports that gout is well-controlled.  He has an acute concern today of hair loss.  He reports a similar distinct spot of hair loss around this time last year where the entire area of hair fell out and then grew back completely gray.  Outpatient Medications Prior to Visit  Medication Sig   allopurinol  (ZYLOPRIM ) 100 MG tablet TAKE 1 TABLET (100 MG TOTAL) BY MOUTH DAILY.   colchicine  0.6 MG tablet TAKE 1 TABLET BY MOUTH DAILY.   flecainide  (TAMBOCOR ) 100 MG tablet Take 300 mg by mouth as needed (if Afib).   metoprolol  succinate (TOPROL  XL) 25 MG 24 hr tablet Take 1 tablet (25 mg total) by mouth daily as needed (FOR AFIB WHEN  TAKING FLECAINIDE ).   [DISCONTINUED] ibuprofen  (ADVIL ) 200 MG tablet Take 400 mg by mouth every 6 (six) hours as needed for fever, headache or mild pain.   Facility-Administered Medications Prior to Visit  Medication Dose Route Frequency Provider   0.9 %  sodium chloride  infusion  500 mL Intravenous Once Nandigam, Kavitha V, MD    ROS Negative unless otherwise noted in HPI   Objective:     BP 114/78   Pulse 93   Temp 98 F (36.7 C) (Oral)   Ht 6' (1.829 m)   Wt 180 lb 12 oz (82 kg)   SpO2 99%   BMI 24.51 kg/m   Physical Exam Constitutional:      General: He is not in acute distress.    Appearance: Normal appearance.  HENT:     Head: Normocephalic and atraumatic.  Cardiovascular:     Rate and Rhythm: Normal rate and regular rhythm.     Heart sounds: Normal heart sounds. No murmur heard.    No friction rub. No gallop.  Pulmonary:      Effort: Pulmonary effort is normal. No respiratory distress.     Breath sounds: Normal breath sounds. No wheezing, rhonchi or rales.  Skin:    General: Skin is warm and dry.     Comments: Patchy areas of hair loss.  Distinct borders, thin gray-white hair growing back in areas of hair loss.  No erythema, crusting, rash.  Neurological:     Mental Status: He is alert and oriented to person, place, and time.  Psychiatric:        Mood and Affect: Mood normal.      Assessment & Plan:  Gouty arthropathy Assessment & Plan: Continue allopurinol  100 mg daily for prophylaxis and colchicine  0.6 mg daily for acute flares.  Will continue to monitor.   Alopecia areata Assessment & Plan: Initiate trial of a medicine dipropionate 0.05% ointment on areas of hair loss of the scalp.  Also referring to dermatology for further evaluation and management as needed.  We will follow-up in 3 months to assess efficacy of betamethasone  in encouraging hair regrowth.  At that time, if no hair regrowth then discontinue the topical corticosteroid and consider alternative therapies if not already established with dermatology.  Taper and discontinue topical corticosteroid therapy over the course of three to six months. If there is hair regrowth, continue the topical corticosteroid and gradually taper the frequency of application.   Orders: -     Betamethasone  Dipropionate; Apply topically daily. Apply to affected areas of hair loss  Dispense: 30 g; Refill: 2 -     Ambulatory referral to Dermatology  Screening for colorectal cancer -     Ambulatory referral to Gastroenterology  Reviewed CBC, CMP, lipid panel. Creatinine low 0.73, bilirubin slightly elevated 1.3 but improved from 6 months ago, LDL increased to 161, triglycerides improved to 167, elevated cholesterol/HDL ratio 5.1.   Return in about 3 months (around 07/26/2023) for follow-up for hair loss.    Joesph DELENA Sear, PA

## 2023-04-27 NOTE — Patient Instructions (Signed)
 The steroid cream called betamethasone should be between $15 and $20 at [facility].

## 2023-05-14 ENCOUNTER — Ambulatory Visit: Payer: Managed Care, Other (non HMO) | Admitting: Family Medicine

## 2023-05-14 VITALS — BP 137/87 | HR 99 | Ht 72.0 in | Wt 181.2 lb

## 2023-05-14 DIAGNOSIS — Z23 Encounter for immunization: Secondary | ICD-10-CM | POA: Diagnosis not present

## 2023-05-14 NOTE — Progress Notes (Signed)
Pt in for second shingrix vaccine, patient tolerated well.

## 2023-05-21 ENCOUNTER — Encounter: Payer: Self-pay | Admitting: Family Medicine

## 2023-05-29 ENCOUNTER — Other Ambulatory Visit: Payer: Self-pay | Admitting: Family Medicine

## 2023-05-29 DIAGNOSIS — M109 Gout, unspecified: Secondary | ICD-10-CM

## 2023-06-09 ENCOUNTER — Ambulatory Visit
Admission: RE | Admit: 2023-06-09 | Discharge: 2023-06-09 | Disposition: A | Discharge status: Home / Self Care | Payer: Managed Care, Other (non HMO) | Source: Ambulatory Visit | Attending: Family Medicine | Admitting: Family Medicine

## 2023-06-09 ENCOUNTER — Ambulatory Visit (INDEPENDENT_AMBULATORY_CARE_PROVIDER_SITE_OTHER): Payer: Managed Care, Other (non HMO)

## 2023-06-09 VITALS — BP 130/81 | HR 98 | Temp 98.3°F | Resp 20 | Ht 72.0 in | Wt 172.0 lb

## 2023-06-09 DIAGNOSIS — R051 Acute cough: Secondary | ICD-10-CM

## 2023-06-09 DIAGNOSIS — J4521 Mild intermittent asthma with (acute) exacerbation: Secondary | ICD-10-CM | POA: Diagnosis not present

## 2023-06-09 DIAGNOSIS — J069 Acute upper respiratory infection, unspecified: Secondary | ICD-10-CM

## 2023-06-09 LAB — POC COVID19/FLU A&B COMBO
Covid Antigen, POC: NEGATIVE
Influenza A Antigen, POC: NEGATIVE
Influenza B Antigen, POC: NEGATIVE

## 2023-06-09 MED ORDER — PREDNISONE 20 MG PO TABS: 40.0000 mg | ORAL_TABLET | Freq: Every day | ORAL | 0 refills | Status: AC

## 2023-06-09 MED ORDER — BENZONATATE 100 MG PO CAPS: 100.0000 mg | ORAL_CAPSULE | Freq: Three times a day (TID) | ORAL | 0 refills | Status: DC | PRN

## 2023-06-09 MED ORDER — ALBUTEROL SULFATE HFA 108 (90 BASE) MCG/ACT IN AERS: 2.0000 | INHALATION_SPRAY | RESPIRATORY_TRACT | 0 refills | Status: AC | PRN

## 2023-06-09 NOTE — ED Triage Notes (Signed)
 Had what I assumed was the flu. 02/21-02/24. Fever as high as 102.9. Headaches and cough will not go away. - Entered by patient

## 2023-06-09 NOTE — ED Provider Notes (Signed)
 [facility]    CSN: [id number] Arrival date & time: 06/09/23  1436      History   Chief Complaint Chief Complaint  Patient presents with   Cough    HPI Daniel Orozco is a 53 y.o. male.    Cough Here for cough and congestion and fever.  Symptoms began on the evening of February 20.  He had fever to 103, and it would come down to 101 with ibuprofen.  He still had 100.9 temperature this morning.  He is felt short of breath some.  No vomiting or diarrhea. He does have a history of atrial fibrillation but that has not been an issue since he had an ablation.  He does not take any blood thinners and he does not take flecainide anymore.   Past Medical History:  Diagnosis Date   Allergy    Arthritis    Blood transfusion without reported diagnosis    Crohn disease (HCC)    in remission since 2000   Paroxysmal atrial fibrillation (HCC)    S/P balloon dilatation of esophageal stricture     Patient Active Problem List   Diagnosis Date Noted   Alopecia areata 04/27/2023   Environmental and seasonal allergies 08/24/2017   Non-celiac gluten sensitivity 08/24/2017   Family history of prostate cancer in father 08/24/2017   PAF (paroxysmal atrial fibrillation) (HCC) 06/26/2015   Gouty arthropathy 06/26/2015   Atrial fibrillation with RVR (HCC) 03/27/2015   Chronic daily headache 02/23/2014   Mood disorder (HCC)-  history of anxiety depression and ADHD 02/23/2014   Snoring 02/23/2014   Attention deficit hyperactivity disorder (ADHD) 08/19/2007   Diaphragmatic hernia 11/15/2005    Past Surgical History:  Procedure Laterality Date   ABLATION     CARDIOVERSION N/A 03/28/2015   Procedure: CARDIOVERSION;  Surgeon: Lars Masson, MD;  Location: [facility] ENDOSCOPY;  Service: Cardiovascular;  Laterality: N/A;   ELECTROPHYSIOLOGIC STUDY N/A 07/03/2015   atrial fibrillation ablation by Dr Johney Frame   ESOPHAGEAL DILATION     FRACTURE SURGERY     TEE WITHOUT CARDIOVERSION  N/A 03/28/2015   Procedure: TRANSESOPHAGEAL ECHOCARDIOGRAM (TEE);  Surgeon: Lars Masson, MD;  Location: [facility] ENDOSCOPY;  Service: Cardiovascular;  Laterality: N/A;       Home Medications    Prior to Admission medications   Medication Sig Start Date End Date Taking? Authorizing Provider  albuterol (VENTOLIN HFA) 108 (90 Base) MCG/ACT inhaler Inhale 2 puffs into the lungs every 4 (four) hours as needed for wheezing or shortness of breath. 06/09/23  Yes Wynette Jersey, Janace Aris, MD  benzonatate (TESSALON) 100 MG capsule Take 1 capsule (100 mg total) by mouth 3 (three) times daily as needed for cough. 06/09/23  Yes Zenia Resides, MD  predniSONE (DELTASONE) 20 MG tablet Take 2 tablets (40 mg total) by mouth daily with breakfast for 5 days. 06/09/23 06/14/23 Yes Zenia Resides, MD  Pseudoephedrine-APAP-DM (DAYQUIL PO) Take by mouth.   Yes [provider]  allopurinol (ZYLOPRIM) 100 MG tablet TAKE 1 TABLET (100 MG TOTAL) BY MOUTH DAILY. 04/23/23   Saralyn Pilar A, PA  colchicine 0.6 MG tablet TAKE 1 TABLET BY MOUTH DAILY. 05/29/23   Melida Quitter, PA    Family History Family History  Problem Relation Age of Onset   Heart disease Maternal Grandfather    Heart disease Father        RBBB   Prostate cancer Father    Healthy Maternal Grandmother    Healthy Paternal  Grandmother    Healthy Paternal Grandfather    Prostate cancer Paternal Grandfather    Healthy Mother    Celiac disease Mother    Stomach cancer Neg Hx    Colon cancer Neg Hx     Social History Social History   Tobacco Use   Smoking status: Former    Current packs/day: 1.00    Average packs/day: 1 pack/day for 2.0 years (2.0 ttl pk-yrs)    Types: Cigarettes    Passive exposure: Past   Smokeless tobacco: Never   Tobacco comments:    quit in 1999, only smoked for 2 years  Vaping Use   Vaping status: Never Used  Substance Use Topics   Alcohol use: Yes    Alcohol/week: 0.0 standard drinks of alcohol     Comment: Occasionally   Drug use: No     Allergies   Other   Review of Systems Review of Systems  Respiratory:  Positive for cough.      Physical Exam Triage Vital Signs ED Triage Vitals  Encounter Vitals Group     BP 06/09/23 1506 130/81     Systolic BP Percentile --      Diastolic BP Percentile --      Pulse Rate 06/09/23 1506 98     Resp 06/09/23 1506 20     Temp 06/09/23 1506 98.3 F (36.8 C)     Temp Source 06/09/23 1506 Oral     SpO2 06/09/23 1506 99 %     Weight 06/09/23 1504 172 lb (78 kg)     Height 06/09/23 1504 6' (1.829 m)     Head Circumference --      Peak Flow --      Pain Score 06/09/23 1502 8     Pain Loc --      Pain Education --      Exclude from Growth Chart --    No data found.  Updated Vital Signs BP 130/81 (BP Location: Left Arm)   Pulse 98   Temp 98.3 F (36.8 C) (Oral)   Resp 20   Ht 6' (1.829 m)   Wt 78 kg   SpO2 99%   BMI 23.33 kg/m   Visual Acuity Right Eye Distance:   Left Eye Distance:   Bilateral Distance:    Right Eye Near:   Left Eye Near:    Bilateral Near:     Physical Exam Vitals reviewed.  Constitutional:      General: He is not in acute distress.    Appearance: He is not toxic-appearing.  HENT:     Right Ear: Tympanic membrane and ear canal normal.     Left Ear: Tympanic membrane and ear canal normal.     Nose: Congestion present.     Mouth/Throat:     Mouth: Mucous membranes are moist.     Comments: There is some clear mucus draining Eyes:     Extraocular Movements: Extraocular movements intact.     Conjunctiva/sclera: Conjunctivae normal.     Pupils: Pupils are equal, round, and reactive to light.  Cardiovascular:     Rate and Rhythm: Normal rate and regular rhythm.     Heart sounds: No murmur heard. Pulmonary:     Effort: No respiratory distress.     Breath sounds: No stridor. No wheezing, rhonchi or rales.     Comments: No wheezing on exam except when he coughs he sounds  wheezy Musculoskeletal:     Cervical back: Neck supple.  Lymphadenopathy:     Cervical: No cervical adenopathy.  Skin:    Capillary Refill: Capillary refill takes less than 2 seconds.     Coloration: Skin is not jaundiced or pale.  Neurological:     General: No focal deficit present.     Mental Status: He is alert and oriented to person, place, and time.  Psychiatric:        Behavior: Behavior normal.      UC Treatments / Results  Labs (all labs ordered are listed, but only abnormal results are displayed) Labs Reviewed  POC COVID19/FLU A&B COMBO - Normal    EKG   Radiology No results found.  Procedures Procedures (including critical care time)  Medications Ordered in UC Medications - No data to display  Initial Impression / Assessment and Plan / UC Course  I have reviewed the triage vital signs and the nursing notes.  Pertinent labs & imaging results that were available during my care of the patient were reviewed by me and considered in my medical decision making (see chart for details).     By my review the chest x-ray is unchanged from prior and is clear.  He is advised of radiology overread.  Albuterol and 5 days of prednisone are sent in for what might be an asthma exacerbation.  Tessalon Perles are sent in for cough Final Clinical Impressions(s) / UC Diagnoses   Final diagnoses:  Acute cough  Viral URI  Mild intermittent asthma with (acute) exacerbation     Discharge Instructions      Test for flu and COVID was negative.  By my review, the chest x-ray is clear and is unchanged from prior x-rays in our system. The radiologist will also read your x-ray, and if their interpretation differs significantly from mine, we will call you.         ED Prescriptions     Medication Sig Dispense Auth. Provider   albuterol (VENTOLIN HFA) 108 (90 Base) MCG/ACT inhaler Inhale 2 puffs into the lungs every 4 (four) hours as needed for wheezing or shortness of  breath. 1 each Zenia Resides, MD   predniSONE (DELTASONE) 20 MG tablet Take 2 tablets (40 mg total) by mouth daily with breakfast for 5 days. 10 tablet Zenia Resides, MD   benzonatate (TESSALON) 100 MG capsule Take 1 capsule (100 mg total) by mouth 3 (three) times daily as needed for cough. 21 capsule Zenia Resides, MD      PDMP not reviewed this encounter.   Zenia Resides, MD 06/09/23 [phone]

## 2023-06-09 NOTE — Discharge Instructions (Addendum)
 Test for flu and COVID was negative.  By my review, the chest x-ray is clear and is unchanged from prior x-rays in our system. The radiologist will also read your x-ray, and if their interpretation differs significantly from mine, we will call you.

## 2023-06-24 ENCOUNTER — Other Ambulatory Visit: Payer: Self-pay | Admitting: Family Medicine

## 2023-06-24 DIAGNOSIS — M109 Gout, unspecified: Secondary | ICD-10-CM

## 2023-07-02 ENCOUNTER — Ambulatory Visit
Admission: RE | Admit: 2023-07-02 | Discharge: 2023-07-02 | Disposition: A | Discharge status: Home / Self Care | Source: Ambulatory Visit | Attending: Physician Assistant | Admitting: Physician Assistant

## 2023-07-02 VITALS — BP 124/82 | HR 74 | Temp 98.3°F | Resp 16

## 2023-07-02 DIAGNOSIS — M109 Gout, unspecified: Secondary | ICD-10-CM

## 2023-07-02 MED ORDER — METHYLPREDNISOLONE ACETATE 80 MG/ML IJ SUSP
80.0000 mg | Freq: Once | INTRAMUSCULAR | Status: AC
Start: 2023-07-02 — End: 2023-07-02
  Administered 2023-07-02: 80 mg via INTRAMUSCULAR

## 2023-07-02 NOTE — ED Triage Notes (Signed)
 Pt reports L knee pain x2 days. No trauma or injury to area. Pt has hx of gout flare ups, but has not had one in awhile. Compliant with gout meds. Knee just feels tight and hard to bend per pt

## 2023-07-02 NOTE — ED Provider Notes (Signed)
 [facility]    CSN: [id number] Arrival date & time: 07/02/23  1442      History   Chief Complaint Chief Complaint  Patient presents with   Knee Pain    Gout left knee - Entered by patient    HPI Daniel Orozco is a 53 y.o. male.   Patient here today for evaluation of left knee pain that started 2 days ago. He has gout at baseline and current symptoms feel similar. He has not had any numbness or tingling. He denies injury. He has not had fever. Movement worsens pain and ROM is limited due to pain and swelling.   The history is provided by the patient.  Knee Pain Associated symptoms: no fever     Past Medical History:  Diagnosis Date   Allergy    Arthritis    Blood transfusion without reported diagnosis    Crohn disease (HCC)    in remission since 2000   Paroxysmal atrial fibrillation (HCC)    S/P balloon dilatation of esophageal stricture     Patient Active Problem List   Diagnosis Date Noted   Alopecia areata 04/27/2023   Environmental and seasonal allergies 08/24/2017   Non-celiac gluten sensitivity 08/24/2017   Family history of prostate cancer in father 08/24/2017   PAF (paroxysmal atrial fibrillation) (HCC) 06/26/2015   Gouty arthropathy 06/26/2015   Atrial fibrillation with RVR (HCC) 03/27/2015   Chronic daily headache 02/23/2014   Mood disorder (HCC)-  history of anxiety depression and ADHD 02/23/2014   Snoring 02/23/2014   Attention deficit hyperactivity disorder (ADHD) 08/19/2007   Diaphragmatic hernia 11/15/2005    Past Surgical History:  Procedure Laterality Date   ABLATION     CARDIOVERSION N/A 03/28/2015   Procedure: CARDIOVERSION;  Surgeon: Lars Masson, MD;  Location: [facility] ENDOSCOPY;  Service: Cardiovascular;  Laterality: N/A;   ELECTROPHYSIOLOGIC STUDY N/A 07/03/2015   atrial fibrillation ablation by Dr Johney Frame   ESOPHAGEAL DILATION     FRACTURE SURGERY     TEE WITHOUT CARDIOVERSION N/A 03/28/2015   Procedure:  TRANSESOPHAGEAL ECHOCARDIOGRAM (TEE);  Surgeon: Lars Masson, MD;  Location: [facility] ENDOSCOPY;  Service: Cardiovascular;  Laterality: N/A;       Home Medications    Prior to Admission medications   Medication Sig Start Date End Date Taking? Authorizing Provider  albuterol (VENTOLIN HFA) 108 (90 Base) MCG/ACT inhaler Inhale 2 puffs into the lungs every 4 (four) hours as needed for wheezing or shortness of breath. 06/09/23  Yes Banister, Janace Aris, MD  allopurinol (ZYLOPRIM) 100 MG tablet TAKE 1 TABLET (100 MG TOTAL) BY MOUTH DAILY. 06/24/23  Yes Sandre Kitty, MD  colchicine 0.6 MG tablet TAKE 1 TABLET BY MOUTH DAILY. 05/29/23  Yes Saralyn Pilar A, PA  benzonatate (TESSALON) 100 MG capsule Take 1 capsule (100 mg total) by mouth 3 (three) times daily as needed for cough. Patient not taking: Reported on 07/02/2023 06/09/23   Zenia Resides, MD  Pseudoephedrine-APAP-DM (DAYQUIL PO) Take by mouth. Patient not taking: Reported on 07/02/2023    [provider]    Family History Family History  Problem Relation Age of Onset   Heart disease Maternal Grandfather    Heart disease Father        RBBB   Prostate cancer Father    Healthy Maternal Grandmother    Healthy Paternal Grandmother    Healthy Paternal Grandfather    Prostate cancer Paternal Grandfather    Healthy Mother    Celiac  disease Mother    Stomach cancer Neg Hx    Colon cancer Neg Hx     Social History Social History   Tobacco Use   Smoking status: Former    Current packs/day: 1.00    Average packs/day: 1 pack/day for 2.0 years (2.0 ttl pk-yrs)    Types: Cigarettes    Passive exposure: Past   Smokeless tobacco: Never   Tobacco comments:    quit in 1996, only smoked for 2 years  Vaping Use   Vaping status: Never Used  Substance Use Topics   Alcohol use: Yes    Alcohol/week: 0.0 standard drinks of alcohol    Comment: Occasionally   Drug use: No     Allergies   Other and Pistachio nut  (diagnostic)   Review of Systems Review of Systems  Constitutional:  Negative for chills and fever.  Eyes:  Negative for discharge and redness.  Musculoskeletal:  Positive for arthralgias and joint swelling.  Skin:  Negative for color change and wound.  Neurological:  Negative for numbness.     Physical Exam Triage Vital Signs ED Triage Vitals  Encounter Vitals Group     BP 07/02/23 1450 124/82     Systolic BP Percentile --      Diastolic BP Percentile --      Pulse Rate 07/02/23 1450 74     Resp 07/02/23 1450 16     Temp 07/02/23 1450 98.3 F (36.8 C)     Temp Source 07/02/23 1450 Oral     SpO2 07/02/23 1450 97 %     Weight --      Height --      Head Circumference --      Peak Flow --      Pain Score 07/02/23 1451 5     Pain Loc --      Pain Education --      Exclude from Growth Chart --    No data found.  Updated Vital Signs BP 124/82 (BP Location: Left Arm)   Pulse 74   Temp 98.3 F (36.8 C) (Oral)   Resp 16   SpO2 97%   Visual Acuity Right Eye Distance:   Left Eye Distance:   Bilateral Distance:    Right Eye Near:   Left Eye Near:    Bilateral Near:     Physical Exam Vitals and nursing note reviewed.  Constitutional:      General: He is not in acute distress.    Appearance: Normal appearance. He is not ill-appearing.  HENT:     Head: Normocephalic and atraumatic.  Eyes:     Conjunctiva/sclera: Conjunctivae normal.  Cardiovascular:     Rate and Rhythm: Normal rate.  Pulmonary:     Effort: Pulmonary effort is normal. No respiratory distress.  Musculoskeletal:     Comments: Mild swelling noted to medial left knee with decreased ROM, specifically flexion due to pain  Neurological:     Mental Status: He is alert.  Psychiatric:        Mood and Affect: Mood normal.        Behavior: Behavior normal.        Thought Content: Thought content normal.      UC Treatments / Results  Labs (all labs ordered are listed, but only abnormal results are  displayed) Labs Reviewed - No data to display  EKG   Radiology No results found.  Procedures Procedures (including critical care time)  Medications Ordered in UC Medications  methylPREDNISolone acetate (DEPO-MEDROL) injection 80 mg (80 mg Intramuscular Given 07/02/23 1543)    Initial Impression / Assessment and Plan / UC Course  I have reviewed the triage vital signs and the nursing notes.  Pertinent labs & imaging results that were available during my care of the patient were reviewed by me and considered in my medical decision making (see chart for details).    Suspect gout flare and will treat with steroid injection as this has worked well for patient in the past.  Encouraged follow-up if no gradual improvement with any worsening symptoms.  Final Clinical Impressions(s) / UC Diagnoses   Final diagnoses:  Acute gout of left knee, unspecified cause   Discharge Instructions   None    ED Prescriptions   None    PDMP not reviewed this encounter.   Tomi Bamberger, PA-C 07/02/23 1700

## 2023-07-29 ENCOUNTER — Ambulatory Visit: Payer: Managed Care, Other (non HMO) | Admitting: Family Medicine

## 2023-09-17 ENCOUNTER — Ambulatory Visit (INDEPENDENT_AMBULATORY_CARE_PROVIDER_SITE_OTHER)

## 2023-09-17 VITALS — BP 123/88 | HR 90 | Ht 72.0 in | Wt 177.1 lb

## 2023-09-17 DIAGNOSIS — F39 Unspecified mood [affective] disorder: Secondary | ICD-10-CM

## 2023-09-17 DIAGNOSIS — L639 Alopecia areata, unspecified: Secondary | ICD-10-CM | POA: Diagnosis not present

## 2023-09-17 MED ORDER — SERTRALINE HCL 50 MG PO TABS: 50.0000 mg | ORAL_TABLET | Freq: Every day | ORAL | 3 refills | Status: DC

## 2023-09-17 NOTE — Progress Notes (Signed)
 Established Patient Office Visit  Subjective   Patient ID: Daniel Orozco, male    DOB: 1971-04-10  Age: 53 y.o. MRN: [id number]  No chief complaint on file.   HPI Bowe A Huitron is a 53 y/o male who presents to the clinic for follow-up on hair loss.  His last visit with Britta Candy in January, patient noticed a distinct spot of hair loss, which he reports he had lost hair in that same distinct area before however in the past it grew back.  He was started on a trial of dipropionate 0.5% ointment on the areas of hair loss on the scalp.  A referral was also placed to dermatology for further evaluation and management as needed.  As of today he reports that the 2 areas of hair loss on his scalp have improved, but are not fully resolved.  He reports that he has been using the dipropionate ointment as directed.  Since his appointment in January, he has also grown out his hair so that he has the ability to cover those areas at.  Patient reports that he had a similar area on his scalp several years ago which he saw dermatologist for and was treated with injections, which worked for him and his hair grew back.  Dermatology left a note in the patient's chart that they have tried to reach out to him 3 times to schedule an initial appointment for evaluation however they have not been able to get in touch with him.  Patient also endorses recent symptoms of increased irritability which he attributes to stress from his job.  He reports that he is not normally an irritable person and would like to discuss medication options to help with this.  He does report that he is actively looking for a new job, which will significantly reduced his stress levels.  Denies SI/HI.  He has only ever been on 1 SSRI back in 2011 for similar symptoms, however he does not remember if it helped or not.  He does report that he has been on many treatments in the past for ADHD, mainly stimulants, which he reports made him more  irritable.    ROS Per HPI.    Objective:     BP 123/88   Pulse 90   Ht 6' (1.829 m)   Wt 177 lb 1.3 oz (80.3 kg)   SpO2 99%   BMI 24.02 kg/m    Physical Exam Constitutional:      General: He is not in acute distress.    Appearance: Normal appearance.     Comments: There are 2 approximately dime sized, circular, well-demarcated areas of hair loss on either side of the temporal surface of the scalp.  Cardiovascular:     Rate and Rhythm: Normal rate and regular rhythm.     Heart sounds: Normal heart sounds. No murmur heard.    No friction rub. No gallop.  Pulmonary:     Effort: Pulmonary effort is normal. No respiratory distress.     Breath sounds: Normal breath sounds.  Musculoskeletal:        General: No swelling.  Skin:    General: Skin is warm and dry.  Neurological:     General: No focal deficit present.     Mental Status: He is alert.  Psychiatric:        Mood and Affect: Mood normal.        Behavior: Behavior normal.        Thought Content: Thought content  normal.      No results found for any visits on 09/17/23.    The 10-year ASCVD risk score (Arnett DK, et al., 2019) is: 6.2%    Assessment & Plan:   Alopecia areata Assessment & Plan: Slightly improved with the dipropionate 0.05% ointment.  Provided patient with the phone number to call the dermatologist office he was referred to by Britta Candy so that he can call and schedule an appointment with them.   Mood disorder (HCC) Assessment & Plan: Start Zoloft 25 mg for 2 weeks as 1/2 tablet.  If tolerating after 2 weeks increase to 50 mg (1 whole tablet).  Had a discussion with patient regarding an interest in starting therapy, however he was not sure this was an option for him right now given how busy he is with work.  Will follow-up with him in person in 8 weeks on mood.   Other orders -     Sertraline HCl; Take 1 tablet (50 mg total) by mouth daily. Take 1/2 tablet (25 mg) for 2 weeks then increase  to whole tablet (50 mg)  Dispense: 30 tablet; Refill: 3     Return in about 8 weeks (around 11/12/2023) for Mood.    Odilia Bennett, PA-C

## 2023-09-17 NOTE — Assessment & Plan Note (Addendum)
 Start Zoloft 25 mg for 2 weeks as 1/2 tablet.  If tolerating after 2 weeks increase to 50 mg (1 whole tablet).  Had a discussion with patient regarding an interest in starting therapy, however he was not sure this was an option for him right now given how busy he is with work.  Will follow-up with him in person in 8 weeks on mood.

## 2023-09-17 NOTE — Assessment & Plan Note (Signed)
 Slightly improved with the dipropionate 0.05% ointment.  Provided patient with the phone number to call the dermatologist office he was referred to by Britta Candy so that he can call and schedule an appointment with them.

## 2023-09-17 NOTE — Patient Instructions (Addendum)
 It was nice to see you today!  As we discussed in clinic: - Please call the number provided for [facility] on [address] to schedule your appointment. Their number is [phone]. Press OPTION 1 to schedule the appointment. - I have sent in the 50 mg tablet of Zoloft to your pharmacy.  Please take half of a tablet (25 mg) for 2 weeks.  As long as you are tolerating this medication well after 2 weeks, you may increase the dose to a whole tablet (50 mg) - I will plan to see you back in 8 weeks for follow-up on mood.  It was nice to meet you!   If you have any problems before your next visit feel free to message me via MyChart (minor issues or questions) or call the office, otherwise you may reach out to schedule an office visit.  Thank you! Meryl Acosta, PA-C

## 2023-09-20 ENCOUNTER — Ambulatory Visit: Admission: EM | Admit: 2023-09-20 | Discharge: 2023-09-20 | Disposition: A | Discharge status: Home / Self Care

## 2023-09-20 DIAGNOSIS — S01112A Laceration without foreign body of left eyelid and periocular area, initial encounter: Secondary | ICD-10-CM

## 2023-09-20 NOTE — ED Provider Notes (Signed)
 [facility]    CSN: [id number] Arrival date & time: 09/20/23  1042      History   Chief Complaint Chief Complaint  Patient presents with   Laceration    HPI Daniel Orozco is a 53 y.o. male.   Patient here today for evaluation of laceration to his left eyebrow that occurred this morning when he was playing tennis.  He states that he accidentally hit himself in the head with the racquet.  He denies any pain in his eye or blurry vision.  He has not had any headaches.  He reports some nausea but states this is because injury occurred after he had been playing tennis for a couple hours and thinks his adrenaline was up and then has now dropped.  He does not have continuing nausea or vomiting.  He denies any loss of consciousness.  The history is provided by the patient.  Laceration Associated symptoms: no fever     Past Medical History:  Diagnosis Date   Allergy    Arthritis    Blood transfusion without reported diagnosis    Crohn disease (HCC)    in remission since 2000   Paroxysmal atrial fibrillation (HCC)    S/P balloon dilatation of esophageal stricture     Patient Active Problem List   Diagnosis Date Noted   Alopecia areata 04/27/2023   Environmental and seasonal allergies 08/24/2017   Non-celiac gluten sensitivity 08/24/2017   Family history of prostate cancer in father 08/24/2017   PAF (paroxysmal atrial fibrillation) (HCC) 06/26/2015   Gouty arthropathy 06/26/2015   Atrial fibrillation with RVR (HCC) 03/27/2015   Chronic daily headache 02/23/2014   Mood disorder (HCC)-  history of anxiety depression and ADHD 02/23/2014   Snoring 02/23/2014   Attention deficit hyperactivity disorder (ADHD) 08/19/2007   Diaphragmatic hernia 11/15/2005    Past Surgical History:  Procedure Laterality Date   ABLATION     CARDIOVERSION N/A 03/28/2015   Procedure: CARDIOVERSION;  Surgeon: Liza Riggers, MD;  Location: [facility] ENDOSCOPY;  Service: Cardiovascular;   Laterality: N/A;   ELECTROPHYSIOLOGIC STUDY N/A 07/03/2015   atrial fibrillation ablation by Dr Nunzio Belch   ESOPHAGEAL DILATION     FRACTURE SURGERY     TEE WITHOUT CARDIOVERSION N/A 03/28/2015   Procedure: TRANSESOPHAGEAL ECHOCARDIOGRAM (TEE);  Surgeon: Liza Riggers, MD;  Location: [facility] ENDOSCOPY;  Service: Cardiovascular;  Laterality: N/A;       Home Medications    Prior to Admission medications   Medication Sig Start Date End Date Taking? Authorizing Provider  allopurinol  (ZYLOPRIM ) 100 MG tablet TAKE 1 TABLET (100 MG TOTAL) BY MOUTH DAILY. 06/24/23  Yes Laneta Pintos, MD  colchicine  0.6 MG tablet TAKE 1 TABLET BY MOUTH DAILY. 05/29/23  Yes Maryclare Smoke A, PA  sertraline  (ZOLOFT ) 50 MG tablet Take 1 tablet (50 mg total) by mouth daily. Take 1/2 tablet (25 mg) for 2 weeks then increase to whole tablet (50 mg) Patient taking differently: Take 25 mg by mouth daily. Take 1/2 tablet (25 mg) for 2 weeks then increase to whole tablet (50 mg) 09/17/23  Yes Jeffie Mingle, Kara F, PA-C  albuterol  (VENTOLIN  HFA) 108 (90 Base) MCG/ACT inhaler Inhale 2 puffs into the lungs every 4 (four) hours as needed for wheezing or shortness of breath. 06/09/23   Ann Keto, MD  predniSONE  (DELTASONE ) 10 MG tablet Take 10 mg by mouth as directed.    [provider]    Family History Family History  Problem Relation  Age of Onset   Heart disease Maternal Grandfather    Heart disease Father        RBBB   Prostate cancer Father    Healthy Maternal Grandmother    Healthy Paternal Grandmother    Healthy Paternal Grandfather    Prostate cancer Paternal Grandfather    Healthy Mother    Celiac disease Mother    Stomach cancer Neg Hx    Colon cancer Neg Hx     Social History Social History   Tobacco Use   Smoking status: Former    Current packs/day: 1.00    Average packs/day: 1 pack/day for 2.0 years (2.0 ttl pk-yrs)    Types: Cigarettes    Passive exposure: Past   Smokeless tobacco: Never    Tobacco comments:    quit in 1996, only smoked for 2 years  Vaping Use   Vaping status: Never Used  Substance Use Topics   Alcohol use: Yes    Alcohol/week: 0.0 standard drinks of alcohol    Comment: Occasionally   Drug use: No     Allergies   Other and Pistachio nut (diagnostic)   Review of Systems Review of Systems  Constitutional:  Negative for chills and fever.  Eyes:  Negative for discharge, redness and visual disturbance.  Respiratory:  Negative for shortness of breath.   Gastrointestinal:  Positive for nausea. Negative for vomiting.  Skin:  Positive for wound. Negative for color change.  Neurological:  Negative for numbness and headaches.     Physical Exam Triage Vital Signs ED Triage Vitals  Encounter Vitals Group     BP 09/20/23 1058 131/76     Systolic BP Percentile --      Diastolic BP Percentile --      Pulse Rate 09/20/23 1058 81     Resp 09/20/23 1058 18     Temp 09/20/23 1058 98.5 F (36.9 C)     Temp Source 09/20/23 1058 Oral     SpO2 09/20/23 1058 97 %     Weight 09/20/23 1055 177 lb 0.5 oz (80.3 kg)     Height 09/20/23 1055 6' (1.829 m)     Head Circumference --      Peak Flow --      Pain Score 09/20/23 1057 6     Pain Loc --      Pain Education --      Exclude from Growth Chart --    No data found.  Updated Vital Signs BP 131/76 (BP Location: Left Arm)   Pulse 81   Temp 98.5 F (36.9 C) (Oral)   Resp 18   Ht 6' (1.829 m)   Wt 177 lb 0.5 oz (80.3 kg)   SpO2 97%   BMI 24.01 kg/m   Visual Acuity Right Eye Distance:   Left Eye Distance:   Bilateral Distance:    Right Eye Near:   Left Eye Near:    Bilateral Near:     Physical Exam Vitals and nursing note reviewed.  Constitutional:      General: He is not in acute distress.    Appearance: Normal appearance. He is not ill-appearing.  HENT:     Head: Normocephalic and atraumatic.  Eyes:     Conjunctiva/sclera: Conjunctivae normal.  Cardiovascular:     Rate and Rhythm:  Normal rate.  Pulmonary:     Effort: Pulmonary effort is normal. No respiratory distress.  Skin:    Comments: Approx 2 cm laceration to left mid eyebrow  without active bleeding or drainage, no significant swelling or erythema  Neurological:     Mental Status: He is alert.  Psychiatric:        Mood and Affect: Mood normal.        Behavior: Behavior normal.        Thought Content: Thought content normal.      UC Treatments / Results  Labs (all labs ordered are listed, but only abnormal results are displayed) Labs Reviewed - No data to display  EKG   Radiology No results found.  Procedures Laceration Repair  Date/Time: 09/20/2023 1:03 PM  Performed by: Vernestine Gondola, PA-C Authorized by: Vernestine Gondola, PA-C   Consent:    Consent obtained:  Verbal   Consent given by:  Patient   Risks discussed:  Infection, pain and poor cosmetic result   Alternatives discussed:  No treatment and delayed treatment Universal protocol:    Procedure explained and questions answered to patient or proxy's satisfaction: yes     Required blood products, implants, devices, and special equipment available: yes     Patient identity confirmed:  Verbally with patient Anesthesia:    Anesthesia method:  None Laceration details:    Location:  Face   Face location:  L eyebrow   Length (cm):  2 Treatment:    Area cleansed with:  Chlorhexidine   Amount of cleaning:  Standard Skin repair:    Repair method:  Tissue adhesive Approximation:    Approximation:  Close Repair type:    Repair type:  Simple Post-procedure details:    Dressing:  Open (no dressing)   Procedure completion:  Tolerated well, no immediate complications  (including critical care time)  Medications Ordered in UC Medications - No data to display  Initial Impression / Assessment and Plan / UC Course  I have reviewed the triage vital signs and the nursing notes.  Pertinent labs & imaging results that were available during  my care of the patient were reviewed by me and considered in my medical decision making (see chart for details).    Laceration repaired in office without complication.  Advised to monitor for any signs of infection and follow-up with any further concerns.  Patient reports he is up-to-date with his tetanus vaccination. (Last Tdap 2024)  Final Clinical Impressions(s) / UC Diagnoses   Final diagnoses:  Laceration of left eyebrow, initial encounter   Discharge Instructions   None    ED Prescriptions   None    PDMP not reviewed this encounter.   Vernestine Gondola, PA-C 09/20/23 [phone]

## 2023-09-20 NOTE — ED Triage Notes (Signed)
"  I was playing tennis and the racket spun in my hand hitting my left eye causing cut/laceration". No pain in eye. No blurry vision.

## 2023-09-23 ENCOUNTER — Other Ambulatory Visit: Payer: Self-pay | Admitting: Family Medicine

## 2023-09-23 DIAGNOSIS — M109 Gout, unspecified: Secondary | ICD-10-CM

## 2023-10-09 ENCOUNTER — Ambulatory Visit

## 2023-11-12 ENCOUNTER — Ambulatory Visit (INDEPENDENT_AMBULATORY_CARE_PROVIDER_SITE_OTHER)

## 2023-11-12 VITALS — BP 120/86 | HR 99 | Ht 72.0 in | Wt 175.8 lb

## 2023-11-12 DIAGNOSIS — F39 Unspecified mood [affective] disorder: Secondary | ICD-10-CM | POA: Diagnosis not present

## 2023-11-12 DIAGNOSIS — Z1211 Encounter for screening for malignant neoplasm of colon: Secondary | ICD-10-CM | POA: Diagnosis not present

## 2023-11-12 MED ORDER — SERTRALINE HCL 100 MG PO TABS: 100.0000 mg | ORAL_TABLET | Freq: Every day | ORAL | 2 refills | Status: AC

## 2023-11-12 NOTE — Progress Notes (Addendum)
   Established Patient Office Visit  Subjective   Patient ID: Daniel Orozco, male    DOB: 1970/06/11  Age: 53 y.o. MRN: [id number]  Chief Complaint  Patient presents with   Medical Management of Chronic Issues    HPI  Jaziel A Antkowiak is a 53 y.o. male who presents to the clinic today for 8 week follow up on mood. Patient reports that he tolerated the 25 mg of Zoloft  well before going up to 50 mg daily. He is currently taking 50 mg daily and reports that this medication has done wonders for his mood, agitation. Reports that he is no longer stressed despite still being in the same job. Reports that his wife has even made mention that he seems much less stressed since starting the Zoloft . He denies side effects. Would like to discuss increasing the dose.   ROS Per HPI.    Objective:     BP 120/86   Pulse 99   Ht 6' (1.829 m)   Wt 175 lb 12.8 oz (79.7 kg)   SpO2 97%   BMI 23.84 kg/m    Physical Exam Constitutional:      General: He is not in acute distress.    Appearance: Normal appearance.  Cardiovascular:     Rate and Rhythm: Normal rate and regular rhythm.     Heart sounds: Normal heart sounds. No murmur heard.    No friction rub. No gallop.  Pulmonary:     Effort: Pulmonary effort is normal. No respiratory distress.     Breath sounds: Normal breath sounds.  Musculoskeletal:        General: No swelling.  Skin:    General: Skin is warm and dry.  Neurological:     General: No focal deficit present.     Mental Status: He is alert.  Psychiatric:        Mood and Affect: Mood normal.        Behavior: Behavior normal.        Thought Content: Thought content normal.    No results found for any visits on 11/12/23.    The 10-year ASCVD risk score (Arnett DK, et al., 2019) is: 5.9%    Assessment & Plan:   Screen for colon cancer -     Ambulatory referral to Gastroenterology  Mood disorder [facility])-  history of anxiety depression and ADHD Assessment &  Plan: PHQ9: 0, GAD7: 0. Patient tolerating Zoloft  well and would like to increase dose. Increase Zoloft  to 75 mg x 2 weeks and then can increase to 100 mg daily thereafter as long as he tolerates it well. Have sent in the 100 mg tablets. Will cont to monitor.   Other orders -     Sertraline  HCl; Take 1 tablet (100 mg total) by mouth daily.  Dispense: 90 tablet; Refill: 2     Return in about 6 months (around 05/14/2024) for Physical.    Saddie JULIANNA Sacks, PA-C

## 2023-11-12 NOTE — Patient Instructions (Signed)
 It was nice to see you today!  As we discussed in clinic:  -You can increase your Zoloft  to 75 mg (1.5 tablets) for 2 weeks, then you can increase to 100 mg thereafter as long as you tolerate it well. I have sent in the 100 mg tablets to your pharmacy.  -Order has been placed to GI for colonoscopy. They will call you to schedule this!   I will see you back in about 6 months for a physical!   If you have any problems before your next visit feel free to message me via MyChart (minor issues or questions) or call the office, otherwise you may reach out to schedule an office visit.  Thank you! Saddie Sacks, PA-C

## 2023-11-12 NOTE — Assessment & Plan Note (Signed)
 PHQ9: 0, GAD7: 0. Patient tolerating Zoloft  well and would like to increase dose. Increase Zoloft  to 75 mg x 2 weeks and then can increase to 100 mg daily thereafter as long as he tolerates it well. Have sent in the 100 mg tablets. Will cont to monitor.

## 2023-12-11 ENCOUNTER — Other Ambulatory Visit: Payer: Self-pay | Admitting: Family Medicine

## 2023-12-11 DIAGNOSIS — M109 Gout, unspecified: Secondary | ICD-10-CM

## 2024-01-11 ENCOUNTER — Other Ambulatory Visit: Payer: Self-pay

## 2024-01-11 DIAGNOSIS — M109 Gout, unspecified: Secondary | ICD-10-CM

## 2024-05-09 ENCOUNTER — Other Ambulatory Visit

## 2024-05-11 ENCOUNTER — Other Ambulatory Visit: Payer: Self-pay

## 2024-05-11 DIAGNOSIS — M109 Gout, unspecified: Secondary | ICD-10-CM

## 2024-05-13 ENCOUNTER — Other Ambulatory Visit

## 2024-05-16 ENCOUNTER — Encounter

## 2024-05-17 ENCOUNTER — Encounter

## 2024-05-17 ENCOUNTER — Encounter: Payer: Self-pay | Admitting: Gastroenterology

## 2024-05-31 ENCOUNTER — Other Ambulatory Visit

## 2024-06-06 ENCOUNTER — Encounter

## 2024-06-24 ENCOUNTER — Encounter

## 2024-07-08 ENCOUNTER — Encounter: Admitting: Gastroenterology
